# Patient Record
Sex: Male | Born: 1940
Health system: Southern US, Community
[De-identification: ages and names within clinical notes are randomized; demographics above are authoritative.]

## PROBLEM LIST (undated history)

## (undated) DIAGNOSIS — K219 Gastro-esophageal reflux disease without esophagitis: Secondary | ICD-10-CM

## (undated) DIAGNOSIS — H9190 Unspecified hearing loss, unspecified ear: Secondary | ICD-10-CM

## (undated) DIAGNOSIS — T753XXA Motion sickness, initial encounter: Secondary | ICD-10-CM

## (undated) DIAGNOSIS — M1990 Unspecified osteoarthritis, unspecified site: Secondary | ICD-10-CM

## (undated) DIAGNOSIS — J302 Other seasonal allergic rhinitis: Secondary | ICD-10-CM

## (undated) DIAGNOSIS — Z8619 Personal history of other infectious and parasitic diseases: Secondary | ICD-10-CM

## (undated) DIAGNOSIS — C801 Malignant (primary) neoplasm, unspecified: Secondary | ICD-10-CM

## (undated) DIAGNOSIS — M549 Dorsalgia, unspecified: Secondary | ICD-10-CM

## (undated) HISTORY — PX: JOINT REPLACEMENT: SHX530

## (undated) HISTORY — PX: CARPAL TUNNEL RELEASE: SHX101

## (undated) HISTORY — PX: PROSTATE SURGERY: SHX751

## (undated) HISTORY — PX: EYE SURGERY: SHX253

## (undated) HISTORY — PX: UPPER GI ENDOSCOPY: SHX6162

## (undated) HISTORY — DX: Malignant (primary) neoplasm, unspecified: C80.1

## (undated) HISTORY — PX: REPLACEMENT TOTAL KNEE BILATERAL: SUR1225

---

## 2000-07-02 ENCOUNTER — Other Ambulatory Visit: Admission: RE | Admit: 2000-07-02 | Discharge: 2000-07-02 | Payer: Self-pay | Admitting: Urology

## 2000-08-24 ENCOUNTER — Encounter: Payer: Self-pay | Admitting: Obstetrics and Gynecology

## 2000-08-31 ENCOUNTER — Inpatient Hospital Stay (HOSPITAL_COMMUNITY): Admission: RE | Admit: 2000-08-31 | Discharge: 2000-09-03 | Payer: Self-pay | Admitting: Urology

## 2005-07-16 ENCOUNTER — Ambulatory Visit: Payer: Self-pay | Admitting: Gastroenterology

## 2009-09-11 ENCOUNTER — Ambulatory Visit: Payer: Self-pay | Admitting: Gastroenterology

## 2010-02-18 ENCOUNTER — Ambulatory Visit: Payer: Self-pay | Admitting: Internal Medicine

## 2011-06-10 HISTORY — PX: COLONOSCOPY: SHX174

## 2011-12-08 ENCOUNTER — Ambulatory Visit: Payer: Self-pay | Admitting: Family Medicine

## 2012-03-17 ENCOUNTER — Ambulatory Visit: Payer: Self-pay | Admitting: Gastroenterology

## 2012-03-17 DIAGNOSIS — K2270 Barrett's esophagus without dysplasia: Secondary | ICD-10-CM | POA: Insufficient documentation

## 2012-03-19 LAB — PATHOLOGY REPORT

## 2013-02-28 DIAGNOSIS — Z85828 Personal history of other malignant neoplasm of skin: Secondary | ICD-10-CM | POA: Insufficient documentation

## 2013-10-05 DIAGNOSIS — M1710 Unilateral primary osteoarthritis, unspecified knee: Secondary | ICD-10-CM | POA: Insufficient documentation

## 2013-10-05 DIAGNOSIS — M179 Osteoarthritis of knee, unspecified: Secondary | ICD-10-CM | POA: Insufficient documentation

## 2015-08-02 ENCOUNTER — Ambulatory Visit (INDEPENDENT_AMBULATORY_CARE_PROVIDER_SITE_OTHER): Payer: Medicare Other

## 2015-08-02 ENCOUNTER — Ambulatory Visit (INDEPENDENT_AMBULATORY_CARE_PROVIDER_SITE_OTHER): Payer: Medicare Other | Admitting: Podiatry

## 2015-08-02 VITALS — BP 182/89 | HR 55 | Resp 18

## 2015-08-02 DIAGNOSIS — M2022 Hallux rigidus, left foot: Secondary | ICD-10-CM

## 2015-08-02 DIAGNOSIS — R52 Pain, unspecified: Secondary | ICD-10-CM

## 2015-08-02 DIAGNOSIS — M2021 Hallux rigidus, right foot: Secondary | ICD-10-CM | POA: Diagnosis not present

## 2015-08-02 NOTE — Progress Notes (Signed)
Subjective:     Patient ID: Kevin Oneal, male   DOB: 07-04-1940, 75 y.o.   MRN: [id number]  HPI 75 year old male presents to the office today for concerns of pain in his big toe joint on the right foot. It has been onging for "quit awhile but getting worse". Wearing dress shoes hurts the most. He is able to wear regular sneaker is well working now without any discomfort. No tingling or numbness. He gets some redness over the joint of the big toe. He has had no treatment.   Review of Systems  All other systems reviewed and are negative.      Objective:   Physical Exam General: AAO x3, NAD  Dermatological: Skin is warm, dry and supple bilateral. Nails x 10 are well manicured; remaining integument appears unremarkable at this time. There are no open sores, no preulcerative lesions, no rash or signs of infection present.  Vascular: Dorsalis Pedis artery and Posterior Tibial artery pedal pulses are 2/4 bilateral with immedate capillary fill time. Pedal hair growth present. No varicosities and no lower extremity edema present bilateral. There is no pain with calf compression, swelling, warmth, erythema.   Neruologic: Grossly intact via light touch bilateral. Vibratory intact via tuning fork bilateral. Protective threshold with Semmes Wienstein monofilament intact to all pedal sites bilateral. Patellar and Achilles deep tendon reflexes 2+ bilateral. No Babinski or clonus noted bilateral.   Musculoskeletal: There is very limited range of motion of the right first MTPJ and there is exostosis present at the dorsal and dorsomedial aspect of the first MTPJ. There is no erythema or increase in warmth. There is crepitation first MTPJ range of motion the right side. There is also decreased range of motion left side however is not painful there is mild crepitation left side. No other areas of tenderness to bilateral lower extremities. MMT 5/5, ROM WNL except otherwise stated.   Gait: Unassisted,  Nonantalgic.      Assessment:     Hallux rigidus right foot    Plan:     -Treatment options discussed including all alternatives, risks, and complications -X-rays were obtained and reviewed with the patient.  -Etiology of symptoms were discussed -I discussed both conservative and surgical treatment options with the patient. At this point healing has pain with his dress shoes and he wears a loafer style shoe. I discussed with him shoe gear changes for dress shoes. Also discuss orthotics. He wishes to hold off on any other treatment at this point including steroid injection until he tries the shoe changes. -Follow-up as needed. Call if questions or concerns.  Celesta Gentile, DPM

## 2016-05-28 ENCOUNTER — Encounter: Payer: Self-pay | Admitting: Family Medicine

## 2016-05-28 ENCOUNTER — Ambulatory Visit (INDEPENDENT_AMBULATORY_CARE_PROVIDER_SITE_OTHER): Payer: Medicare Other | Admitting: Family Medicine

## 2016-05-28 VITALS — BP 120/76 | HR 64 | Ht 67.0 in | Wt 173.0 lb

## 2016-05-28 DIAGNOSIS — Z23 Encounter for immunization: Secondary | ICD-10-CM | POA: Diagnosis not present

## 2016-05-28 DIAGNOSIS — Z0000 Encounter for general adult medical examination without abnormal findings: Secondary | ICD-10-CM | POA: Diagnosis not present

## 2016-05-28 DIAGNOSIS — H9193 Unspecified hearing loss, bilateral: Secondary | ICD-10-CM | POA: Diagnosis not present

## 2016-05-28 DIAGNOSIS — Z1211 Encounter for screening for malignant neoplasm of colon: Secondary | ICD-10-CM | POA: Diagnosis not present

## 2016-05-28 DIAGNOSIS — I10 Essential (primary) hypertension: Secondary | ICD-10-CM

## 2016-05-28 LAB — HEMOCCULT GUIAC POC 1CARD (OFFICE): Fecal Occult Blood, POC: NEGATIVE

## 2016-05-28 NOTE — Progress Notes (Signed)
Name: Kevin Oneal   MRN: [id number]    DOB: 1940-07-08   Date:05/28/2016       Progress Note  Subjective  Chief Complaint  Chief Complaint  Patient presents with  . Annual Exam    having hearing problems "watching television"    Patient presents for annual physical exam.    No problem-specific Assessment & Plan notes found for this encounter.   Past Medical History:  Diagnosis Date  . Cancer [facility])    prostate    Past Surgical History:  Procedure Laterality Date  . COLONOSCOPY  06/10/2011   Dr Candace Cruise- normal  . PROSTATE SURGERY     removal of prostate due to cancer  . REPLACEMENT TOTAL KNEE BILATERAL      Family History  Problem Relation Age of Onset  . Cancer Father     Social History   Social History  . Marital status: Married    Spouse name: N/A  . Number of children: N/A  . Years of education: N/A   Occupational History  . Not on file.   Social History Main Topics  . Smoking status: Former Research scientist (life sciences)  . Smokeless tobacco: Never Used  . Alcohol use Yes  . Drug use: No  . Sexual activity: Yes   Other Topics Concern  . Not on file   Social History Narrative  . No narrative on file    No Known Allergies   Review of Systems  Constitutional: Negative for chills, fever, malaise/fatigue and weight loss.  HENT: Positive for hearing loss. Negative for ear discharge, ear pain and sore throat.   Eyes: Negative for blurred vision.  Respiratory: Negative for cough, sputum production, shortness of breath and wheezing.   Cardiovascular: Negative for chest pain, palpitations and leg swelling.  Gastrointestinal: Negative for abdominal pain, blood in stool, constipation, diarrhea, heartburn, melena and nausea.  Genitourinary: Negative for dysuria, frequency, hematuria and urgency.  Musculoskeletal: Negative for back pain, joint pain, myalgias and neck pain.  Skin: Negative for rash.  Neurological: Negative for dizziness, tingling, sensory change, focal  weakness and headaches.  Endo/Heme/Allergies: Negative for environmental allergies and polydipsia. Does not bruise/bleed easily.  Psychiatric/Behavioral: Negative for depression and suicidal ideas. The patient is not nervous/anxious and does not have insomnia.      Objective  Vitals:   05/28/16 0855  BP: 120/76  Pulse: 64  Weight: 173 lb (78.5 kg)  Height: 5\' 7"  (1.702 m)    Physical Exam  Constitutional: He is oriented to person, place, and time and well-developed, well-nourished, and in no distress.  HENT:  Head: Normocephalic.  Right Ear: External ear normal.  Left Ear: External ear normal.  Nose: Nose normal.  Mouth/Throat: Oropharynx is clear and moist.  Eyes: Conjunctivae and EOM are normal. Pupils are equal, round, and reactive to light. Right eye exhibits no discharge. Left eye exhibits no discharge. No scleral icterus.  Neck: Normal range of motion. Neck supple. No JVD present. No tracheal deviation present. No thyromegaly present.  Cardiovascular: Normal rate, regular rhythm, normal heart sounds and intact distal pulses.  Exam reveals no gallop and no friction rub.   No murmur heard. Pulmonary/Chest: Breath sounds normal. No respiratory distress. He has no wheezes. He has no rales.  Abdominal: Soft. Bowel sounds are normal. He exhibits no mass. There is no hepatosplenomegaly. There is no tenderness. There is no rebound, no guarding and no CVA tenderness.  Genitourinary: Rectum normal and prostate normal.  Musculoskeletal: Normal range of motion. He  exhibits no edema or tenderness.  Lymphadenopathy:    He has no cervical adenopathy.  Neurological: He is alert and oriented to person, place, and time. He has normal sensation, normal strength, normal reflexes and intact cranial nerves. No cranial nerve deficit.  Skin: Skin is warm. No rash noted.  Psychiatric: Mood and affect normal.  Nursing note and vitals reviewed.     Assessment & Plan  Problem List Items  Addressed This Visit    None    Visit Diagnoses    Annual physical exam    -  Primary   Decreased hearing of both ears       Relevant Orders   Ambulatory referral to ENT   Essential hypertension       Relevant Medications   aspirin EC 81 MG tablet   Other Relevant Orders   Renal Function Panel   Lipid Profile   Need for pneumococcal vaccination       Relevant Orders   Pneumococcal conjugate vaccine 13-valent (Completed)   Colon cancer screening       Relevant Orders   POCT Occult Blood Stool (Completed)        Dr. Otilio Miu [facility]  05/28/16

## 2016-05-29 ENCOUNTER — Other Ambulatory Visit: Payer: Self-pay

## 2016-05-29 LAB — RENAL FUNCTION PANEL
Albumin: 4.7 g/dL (ref 3.5–4.8)
BUN / CREAT RATIO: 14 (ref 10–24)
BUN: 14 mg/dL (ref 8–27)
CALCIUM: 9.4 mg/dL (ref 8.6–10.2)
CO2: 25 mmol/L (ref 18–29)
CREATININE: 1.01 mg/dL (ref 0.76–1.27)
Chloride: 99 mmol/L (ref 96–106)
GFR calc non Af Amer: 71 mL/min/{1.73_m2} (ref >59)
GFR, EST AFRICAN AMERICAN: 83 mL/min/{1.73_m2} (ref >59)
Glucose: 84 mg/dL (ref 65–99)
Phosphorus: 2.8 mg/dL (ref 2.5–4.5)
Potassium: 4.7 mmol/L (ref 3.5–5.2)
SODIUM: 141 mmol/L (ref 134–144)

## 2016-05-29 LAB — LIPID PANEL
CHOL/HDL RATIO: 4 ratio (ref 0.0–5.0)
CHOLESTEROL TOTAL: 198 mg/dL (ref 100–199)
HDL: 49 mg/dL (ref >39)
LDL CALC: 130 mg/dL — AB (ref 0–99)
Triglycerides: 95 mg/dL (ref 0–149)
VLDL CHOLESTEROL CAL: 19 mg/dL (ref 5–40)

## 2017-07-01 ENCOUNTER — Ambulatory Visit: Payer: Medicare Other

## 2017-07-02 ENCOUNTER — Encounter: Payer: Medicare Other | Admitting: Family Medicine

## 2017-07-30 ENCOUNTER — Ambulatory Visit (INDEPENDENT_AMBULATORY_CARE_PROVIDER_SITE_OTHER): Payer: Medicare Other

## 2017-07-30 ENCOUNTER — Ambulatory Visit (INDEPENDENT_AMBULATORY_CARE_PROVIDER_SITE_OTHER): Payer: Medicare Other | Admitting: Family Medicine

## 2017-07-30 ENCOUNTER — Encounter: Payer: Self-pay | Admitting: Family Medicine

## 2017-07-30 VITALS — BP 130/80 | HR 64 | Ht 67.0 in | Wt 173.0 lb

## 2017-07-30 DIAGNOSIS — M6740 Ganglion, unspecified site: Secondary | ICD-10-CM

## 2017-07-30 DIAGNOSIS — Z0000 Encounter for general adult medical examination without abnormal findings: Secondary | ICD-10-CM

## 2017-07-30 DIAGNOSIS — Z1211 Encounter for screening for malignant neoplasm of colon: Secondary | ICD-10-CM

## 2017-07-30 DIAGNOSIS — Z23 Encounter for immunization: Secondary | ICD-10-CM

## 2017-07-30 DIAGNOSIS — E7800 Pure hypercholesterolemia, unspecified: Secondary | ICD-10-CM

## 2017-07-30 DIAGNOSIS — D225 Melanocytic nevi of trunk: Secondary | ICD-10-CM

## 2017-07-30 LAB — HEMOCCULT GUIAC POC 1CARD (OFFICE): Fecal Occult Blood, POC: NEGATIVE

## 2017-07-30 NOTE — Progress Notes (Signed)
Name: DELSHAWN Oneal   MRN: [id number]    DOB: [birth year]   Date:07/30/2017       Progress Note  Subjective  Chief Complaint  Chief Complaint  Patient presents with  . Annual Exam    L) hand feels like it is asleep when he wakes up in the middle of the night.  . Ganglion Cyst    R) wrist- has been there "for years"- but now is hurting him    Patient physical exam.   Wrist Pain   The pain is present in the right wrist. This is a chronic (ganglion) problem. The current episode started more than 1 year ago. There has been no history of extremity trauma. The problem occurs constantly (pain). The problem has been gradually worsening. The quality of the pain is described as aching. The pain is at a severity of 7/10. The pain is moderate. Pertinent negatives include no fever, numbness, stiffness or tingling. The symptoms are aggravated by activity. He has tried NSAIDS for the symptoms. The treatment provided no relief.  Hand Pain   The incident occurred more than 1 week ago. There was no injury mechanism. The pain is present in the right fingers (mp/thumb). The quality of the pain is described as aching. The pain is moderate. Pertinent negatives include no chest pain, numbness or tingling.    No problem-specific Assessment & Plan notes found for this encounter.   Past Medical History:  Diagnosis Date  . Cancer [facility])    prostate    Past Surgical History:  Procedure Laterality Date  . COLONOSCOPY  06/10/2011   Dr Candace Cruise- normal  . PROSTATE SURGERY     removal of prostate due to cancer  . REPLACEMENT TOTAL KNEE BILATERAL      Family History  Problem Relation Age of Onset  . Cancer Father   . Diabetes Mother   . Diabetes Brother     Social History   Socioeconomic History  . Marital status: Divorced    Spouse name: Not on file  . Number of children: 3  . Years of education: some college  . Highest education level: 12th grade  Social Needs  . Financial resource strain: Not  hard at all  . Food insecurity - worry: Never true  . Food insecurity - inability: Never true  . Transportation needs - medical: No  . Transportation needs - non-medical: No  Occupational History  . Occupation: Retired  Tobacco Use  . Smoking status: Former Smoker    Packs/day: 0.50    Years: 11.00    Pack years: 5.50    Types: Cigarettes    Last attempt to quit: 1980    Years since quitting: 39.1  . Smokeless tobacco: Never Used  . Tobacco comment: smoking cessation materials not required  Substance and Sexual Activity  . Alcohol use: Yes    Alcohol/week: 4.2 oz    Types: 7 Shots of liquor per week  . Drug use: No  . Sexual activity: Yes  Other Topics Concern  . Not on file  Social History Narrative  . Not on file    No Known Allergies  Outpatient Medications Prior to Visit  Medication Sig Dispense Refill  . aspirin EC 81 MG tablet Take 81 mg by mouth daily.    Marland Kitchen ibuprofen (ADVIL,MOTRIN) 200 MG tablet Take 200 mg by mouth every 6 (six) hours as needed.    . Multiple Vitamins-Minerals (ONE-A-DAY MENS 50+ ADVANTAGE PO) Take 1 tablet by mouth daily.    Marland Kitchen  niacin 500 MG tablet Take 1 tablet by mouth daily.    . Omega-3 1000 MG CAPS Take 2 capsules by mouth daily.    . naproxen sodium (ALEVE) 220 MG tablet Take 1 tablet by mouth as needed.     No facility-administered medications prior to visit.     Review of Systems  Constitutional: Negative for chills, fever, malaise/fatigue and weight loss.  HENT: Negative for ear discharge, ear pain and sore throat.   Eyes: Negative for blurred vision.  Respiratory: Negative for cough, sputum production, shortness of breath and wheezing.   Cardiovascular: Negative for chest pain, palpitations and leg swelling.  Gastrointestinal: Negative for abdominal pain, blood in stool, constipation, diarrhea, heartburn, melena and nausea.  Genitourinary: Negative for dysuria, frequency, hematuria and urgency.  Musculoskeletal: Negative for back  pain, joint pain, myalgias, neck pain and stiffness.  Skin: Negative for rash.  Neurological: Negative for dizziness, tingling, sensory change, focal weakness, numbness and headaches.  Endo/Heme/Allergies: Negative for environmental allergies and polydipsia. Does not bruise/bleed easily.  Psychiatric/Behavioral: Negative for depression and suicidal ideas. The patient is not nervous/anxious and does not have insomnia.      Objective  Vitals:   07/30/17 0835  BP: 130/80  Pulse: 64  Weight: 173 lb (78.5 kg)  Height: 5\' 7"  (1.702 m)    Physical Exam  Constitutional: He is oriented to person, place, and time and well-developed, well-nourished, and in no distress. Vital signs are normal.  HENT:  Head: Normocephalic.  Right Ear: Tympanic membrane, external ear and ear canal normal.  Left Ear: Tympanic membrane, external ear and ear canal normal.  Nose: Nose normal.  Mouth/Throat: Uvula is midline, oropharynx is clear and moist and mucous membranes are normal. No oropharyngeal exudate, posterior oropharyngeal edema or posterior oropharyngeal erythema.  Eyes: Conjunctivae, EOM and lids are normal. Pupils are equal, round, and reactive to light. Right eye exhibits no discharge. Left eye exhibits no discharge. No scleral icterus.  Fundoscopic exam:      The right eye shows no arteriolar narrowing and no AV nicking.       The left eye shows no arteriolar narrowing and no AV nicking.  Neck: Trachea normal and normal range of motion. Neck supple. Normal carotid pulses, no hepatojugular reflux and no JVD present. Carotid bruit is not present. No tracheal deviation present. No thyroid mass and no thyromegaly present.  Cardiovascular: Normal rate, regular rhythm, S1 normal, S2 normal, normal heart sounds, intact distal pulses and normal pulses. PMI is not displaced. Exam reveals no gallop, no S3, no S4, no friction rub and no decreased pulses.  No murmur heard. Pulmonary/Chest: Breath sounds normal.  No respiratory distress. He has no wheezes. He has no rales. Right breast exhibits no mass. Left breast exhibits no mass.  Abdominal: Soft. Normal aorta and bowel sounds are normal. He exhibits no mass. There is no hepatosplenomegaly. There is no tenderness. There is no rebound, no guarding and no CVA tenderness.  Genitourinary: Rectum normal, prostate normal and testes/scrotum normal.  Genitourinary Comments: No prostate /exam c/x history  Musculoskeletal: Normal range of motion. He exhibits no edema.       Right wrist: He exhibits tenderness and deformity.  Ganglion cyst/tenderness fisrt MP  Lymphadenopathy:       Head (right side): No submental and no submandibular adenopathy present.       Head (left side): No submental and no submandibular adenopathy present.    He has no cervical adenopathy.  Neurological: He is  alert and oriented to person, place, and time. He has normal sensation, normal strength, normal reflexes and intact cranial nerves. No cranial nerve deficit.  Skin: Skin is warm. No rash noted.  Irregular pigment nevus? chest  Psychiatric: Mood and affect normal.  Nursing note and vitals reviewed.     Assessment & Plan  Problem List Items Addressed This Visit      Other   Pure hypercholesterolemia   Relevant Orders   Lipid panel    Other Visit Diagnoses    Annual physical exam    -  Primary   Relevant Orders   Renal Function Panel   POCT occult blood stool (Completed)   Melanocytic nevus of trunk       Relevant Orders   Ambulatory referral to Dermatology   Ganglion cyst       Relevant Orders   Ambulatory referral to Orthopedic Surgery   Colon cancer screening       Relevant Orders   POCT occult blood stool (Completed)      No orders of the defined types were placed in this encounter. Kevin Oneal is a 77 y.o. male who presents today for his Complete Annual Exam. He feels well. He reports exercising daily. He reports he is sleeping well.  Immunizations  are reviewed and recommendations provided.   Age appropriate screening tests are discussed. Counseling given for risk factor reduction interventions. Health risks of being over weight were discussed and patient was counseled on weight loss options and exercise.  Dr. [facility]  07/30/17

## 2017-07-30 NOTE — Patient Instructions (Signed)
[name] , Thank you for taking time to come for your Medicare Wellness Visit. I appreciate your ongoing commitment to your health goals. Please review the following plan we discussed and let me know if I can assist you in the future.   Screening recommendations/referrals: Colorectal Screening: Completed colonoscopy 09/11/09. Repeat every 10 years  Vision/Dental Exams: Recommended yearly ophthalmology/optometry visit for glaucoma screening and checkup Recommended yearly dental visit for hygiene and checkup  Vaccinations: Influenza vaccine: Up to date Pneumococcal vaccine: Completed series today Tdap vaccine: Up to date Shingles vaccine: Please call your insurance company to determine your out of pocket expense for the Shingrix vaccine. You may also receive this vaccine at your local pharmacy or Health Dept.   Advanced directives: Advance directive discussed with you today. I have provided a copy for you to complete at home and have notarized. Once this is complete please bring a copy in to our office so we can scan it into your chart.  Conditions/risks identified: Recommend to drink at least 6-8 8oz glasses of water per day.  Next appointment: Please schedule your Annual Wellness Visit with your Nurse Health Advisor in one year.  Preventive Care 27 Years and Older, Male Preventive care refers to lifestyle choices and visits with your health care provider that can promote health and wellness. What does preventive care include?  A yearly physical exam. This is also called an annual well check.  Dental exams once or twice a year.  Routine eye exams. Ask your health care provider how often you should have your eyes checked.  Personal lifestyle choices, including:  Daily care of your teeth and gums.  Regular physical activity.  Eating a healthy diet.  Avoiding tobacco and drug use.  Limiting alcohol use.  Practicing safe sex.  Taking low doses of aspirin every day.  Taking  vitamin and mineral supplements as recommended by your health care provider. What happens during an annual well check? The services and screenings done by your health care provider during your annual well check will depend on your age, overall health, lifestyle risk factors, and family history of disease. Counseling  Your health care provider may ask you questions about your:  Alcohol use.  Tobacco use.  Drug use.  Emotional well-being.  Home and relationship well-being.  Sexual activity.  Eating habits.  History of falls.  Memory and ability to understand (cognition).  Work and work Statistician. Screening  You may have the following tests or measurements:  Height, weight, and BMI.  Blood pressure.  Lipid and cholesterol levels. These may be checked every 5 years, or more frequently if you are over 93 years old.  Skin check.  Lung cancer screening. You may have this screening every year starting at age 9 if you have a 30-pack-year history of smoking and currently smoke or have quit within the past 15 years.  Fecal occult blood test (FOBT) of the stool. You may have this test every year starting at age 25.  Flexible sigmoidoscopy or colonoscopy. You may have a sigmoidoscopy every 5 years or a colonoscopy every 10 years starting at age 73.  Prostate cancer screening. Recommendations will vary depending on your family history and other risks.  Hepatitis C blood test.  Hepatitis B blood test.  Sexually transmitted disease (STD) testing.  Diabetes screening. This is done by checking your blood sugar (glucose) after you have not eaten for a while (fasting). You may have this done every 1-3 years.  Abdominal aortic aneurysm (  AAA) screening. You may need this if you are a current or former smoker.  Osteoporosis. You may be screened starting at age 62 if you are at high risk. Talk with your health care provider about your test results, treatment options, and if  necessary, the need for more tests. Vaccines  Your health care provider may recommend certain vaccines, such as:  Influenza vaccine. This is recommended every year.  Tetanus, diphtheria, and acellular pertussis (Tdap, Td) vaccine. You may need a Td booster every 10 years.  Zoster vaccine. You may need this after age 81.  Pneumococcal 13-valent conjugate (PCV13) vaccine. One dose is recommended after age 54.  Pneumococcal polysaccharide (PPSV23) vaccine. One dose is recommended after age 48. Talk to your health care provider about which screenings and vaccines you need and how often you need them. This information is not intended to replace advice given to you by your health care provider. Make sure you discuss any questions you have with your health care provider. Document Released: 06/22/2015 Document Revised: 02/13/2016 Document Reviewed: 03/27/2015 Elsevier Interactive Patient Education  2017 [place] Prevention in the Home Falls can cause injuries. They can happen to people of all ages. There are many things you can do to make your home safe and to help prevent falls. What can I do on the outside of my home?  Regularly fix the edges of walkways and driveways and fix any cracks.  Remove anything that might make you trip as you walk through a door, such as a raised step or threshold.  Trim any bushes or trees on the path to your home.  Use bright outdoor lighting.  Clear any walking paths of anything that might make someone trip, such as rocks or tools.  Regularly check to see if handrails are loose or broken. Make sure that both sides of any steps have handrails.  Any raised decks and porches should have guardrails on the edges.  Have any leaves, snow, or ice cleared regularly.  Use sand or salt on walking paths during winter.  Clean up any spills in your garage right away. This includes oil or grease spills. What can I do in the bathroom?  Use night  lights.  Install grab bars by the toilet and in the tub and shower. Do not use towel bars as grab bars.  Use non-skid mats or decals in the tub or shower.  If you need to sit down in the shower, use a plastic, non-slip stool.  Keep the floor dry. Clean up any water that spills on the floor as soon as it happens.  Remove soap buildup in the tub or shower regularly.  Attach bath mats securely with double-sided non-slip rug tape.  Do not have throw rugs and other things on the floor that can make you trip. What can I do in the bedroom?  Use night lights.  Make sure that you have a light by your bed that is easy to reach.  Do not use any sheets or blankets that are too big for your bed. They should not hang down onto the floor.  Have a firm chair that has side arms. You can use this for support while you get dressed.  Do not have throw rugs and other things on the floor that can make you trip. What can I do in the kitchen?  Clean up any spills right away.  Avoid walking on wet floors.  Keep items that you use a lot in  easy-to-reach places.  If you need to reach something above you, use a strong step stool that has a grab bar.  Keep electrical cords out of the way.  Do not use floor polish or wax that makes floors slippery. If you must use wax, use non-skid floor wax.  Do not have throw rugs and other things on the floor that can make you trip. What can I do with my stairs?  Do not leave any items on the stairs.  Make sure that there are handrails on both sides of the stairs and use them. Fix handrails that are broken or loose. Make sure that handrails are as long as the stairways.  Check any carpeting to make sure that it is firmly attached to the stairs. Fix any carpet that is loose or worn.  Avoid having throw rugs at the top or bottom of the stairs. If you do have throw rugs, attach them to the floor with carpet tape.  Make sure that you have a light switch at the  top of the stairs and the bottom of the stairs. If you do not have them, ask someone to add them for you. What else can I do to help prevent falls?  Wear shoes that:  Do not have high heels.  Have rubber bottoms.  Are comfortable and fit you well.  Are closed at the toe. Do not wear sandals.  If you use a stepladder:  Make sure that it is fully opened. Do not climb a closed stepladder.  Make sure that both sides of the stepladder are locked into place.  Ask someone to hold it for you, if possible.  Clearly mark and make sure that you can see:  Any grab bars or handrails.  First and last steps.  Where the edge of each step is.  Use tools that help you move around (mobility aids) if they are needed. These include:  Canes.  Walkers.  Scooters.  Crutches.  Turn on the lights when you go into a dark area. Replace any light bulbs as soon as they burn out.  Set up your furniture so you have a clear path. Avoid moving your furniture around.  If any of your floors are uneven, fix them.  If there are any pets around you, be aware of where they are.  Review your medicines with your doctor. Some medicines can make you feel dizzy. This can increase your chance of falling. Ask your doctor what other things that you can do to help prevent falls. This information is not intended to replace advice given to you by your health care provider. Make sure you discuss any questions you have with your health care provider. Document Released: 03/22/2009 Document Revised: 11/01/2015 Document Reviewed: 06/30/2014 Elsevier Interactive Patient Education  2017 [employer].

## 2017-07-30 NOTE — Progress Notes (Signed)
Subjective:   Kevin Oneal is a 77 y.o. male who presents for Medicare Annual/Subsequent preventive examination.  Review of Systems:  N/A Cardiac Risk Factors include: advanced age (>37men, >60 women);dyslipidemia     Objective:    Vitals: BP 130/80   Pulse 64   Ht 5\' 7"  (1.702 m)   Wt 173 lb (78.5 kg)   BMI 27.10 kg/m   Body mass index is 27.1 kg/m.  Advanced Directives 07/30/2017  Does Patient Have a Medical Advance Directive? No  Would patient like information on creating a medical advance directive? Yes (MAU/Ambulatory/Procedural Areas - Information given)    Tobacco Social History   Tobacco Use  Smoking Status Former Smoker  . Packs/day: 0.50  . Years: 11.00  . Pack years: 5.50  . Types: Cigarettes  . Last attempt to quit: 1980  . Years since quitting: 39.1  Smokeless Tobacco Never Used  Tobacco Comment   smoking cessation materials not required     Counseling given: No Comment: smoking cessation materials not required   Clinical Intake:  Pre-visit preparation completed: Yes  Pain : No/denies pain   BMI - recorded: 27.1 Nutritional Status: BMI 25 -29 Overweight Nutritional Risks: None Diabetes: No  How often do you need to have someone help you when you read instructions, pamphlets, or other written materials from your doctor or pharmacy?: 1 - Never  Interpreter Needed?: No  Information entered by :: AEversole, LPN  Past Medical History:  Diagnosis Date  . Cancer [facility])    prostate   Past Surgical History:  Procedure Laterality Date  . COLONOSCOPY  06/10/2011   Dr Candace Cruise- normal  . PROSTATE SURGERY     removal of prostate due to cancer  . REPLACEMENT TOTAL KNEE BILATERAL     Family History  Problem Relation Age of Onset  . Cancer Father   . Diabetes Mother   . Diabetes Brother    Social History   Socioeconomic History  . Marital status: Divorced    Spouse name: None  . Number of children: 3  . Years of education: some college    . Highest education level: 12th grade  Social Needs  . Financial resource strain: Not hard at all  . Food insecurity - worry: Never true  . Food insecurity - inability: Never true  . Transportation needs - medical: No  . Transportation needs - non-medical: No  Occupational History  . Occupation: Retired  Tobacco Use  . Smoking status: Former Smoker    Packs/day: 0.50    Years: 11.00    Pack years: 5.50    Types: Cigarettes    Last attempt to quit: 1980    Years since quitting: 39.1  . Smokeless tobacco: Never Used  . Tobacco comment: smoking cessation materials not required  Substance and Sexual Activity  . Alcohol use: Yes    Alcohol/week: 4.2 oz    Types: 7 Shots of liquor per week  . Drug use: No  . Sexual activity: Yes  Other Topics Concern  . None  Social History Narrative  . None    Outpatient Encounter Medications as of 07/30/2017  Medication Sig  . aspirin EC 81 MG tablet Take 81 mg by mouth daily.  Marland Kitchen ibuprofen (ADVIL,MOTRIN) 200 MG tablet Take 200 mg by mouth every 6 (six) hours as needed.  . Multiple Vitamins-Minerals (ONE-A-DAY MENS 50+ ADVANTAGE PO) Take 1 tablet by mouth daily.  . niacin 500 MG tablet Take 1 tablet by mouth daily.  Marland Kitchen  Omega-3 1000 MG CAPS Take 2 capsules by mouth daily.   No facility-administered encounter medications on file as of 07/30/2017.     Activities of Daily Living In your present state of health, do you have any difficulty performing the following activities: 07/30/2017  Hearing? Y  Comment tinnitus; followed by audiology; denies hearing aids  Vision? N  Comment wears contacts  Difficulty concentrating or making decisions? Y  Comment short term memory loss  Walking or climbing stairs? N  Dressing or bathing? N  Doing errands, shopping? N  Preparing Food and eating ? N  Comment denies dentures  Using the Toilet? N  In the past six months, have you accidently leaked urine? Y  Comment stress incontinence  Do you have  problems with loss of bowel control? N  Managing your Medications? N  Managing your Finances? N  Housekeeping or managing your Housekeeping? N  Some recent data might be hidden    Patient Care Team: Juline Patch, MD as PCP - General (Family Medicine)   Assessment:   This is a routine wellness examination for [place].  Exercise Activities and Dietary recommendations Current Exercise Habits: Structured exercise class, Type of exercise: stretching;strength training/weights, Time (Minutes): > 60, Frequency (Times/Week): 3, Weekly Exercise (Minutes/Week): 0, Intensity: Mild  Goals    . DIET - INCREASE WATER INTAKE     Recommend to drink at least 6-8 8oz glasses of water per day.       Fall Risk Fall Risk  07/30/2017 07/30/2017 05/28/2016  Falls in the past year? No No No   Is the patient's home free of loose throw rugs in walkways, pet beds, electrical cords, etc?   Yes Does the patient have any grab bars in the bathroom? Yes  Does the patient use a shower chair when bathing? No Does the patient have any stairs in or around the home? Yes If so, are there any handrails?  Yes Does the patient have adequate lighting?  Yes Does the patient use a cane, walker or w/c? No Does the patient use of an elevated toilet seat? Yes   Timed Get Up and Go Performed: Yes. Pt ambulated 10 feet within 7 sec. Gait stead-fast and without the use of an assistive device. No intervention required at this time. Fall risk prevention has been discussed.  Pt declined my offer to send Community Resource Referral to Care Guide for shower chair.  Depression Screen PHQ 2/9 Scores 07/30/2017 07/30/2017 07/30/2017 05/28/2016  PHQ - 2 Score 0 1 1 0  PHQ- 9 Score 0 2 - -    Cognitive Function     6CIT Screen 07/30/2017  What Year? 0 points  What month? 0 points  What time? 0 points  Count back from 20 0 points  Months in reverse 0 points  Repeat phrase 0 points  Total Score 0    Immunization History   Administered Date(s) Administered  . Pneumococcal Conjugate-13 05/28/2016  . Pneumococcal Polysaccharide-23 07/30/2017  . Td 03/26/2015  . Zoster 03/26/2015    Qualifies for Shingles Vaccine? Yes. Zostavax completed 03/1715. Due for Shingrix vaccine. Education has been provided regarding the importance of this vaccine. Pt has been advised to call his insurance company to determine his out of pocket expense. Advised he may also receive this vaccine at his local pharmacy or Health Dept. Verbalized acceptance and understanding.  Screening Tests Health Maintenance  Topic Date Due  . TETANUS/TDAP  03/25/2025  . INFLUENZA VACCINE  Completed  .  PNA vac Low Risk Adult  Completed   Cancer Screenings: Lung: Low Dose CT Chest recommended if Age 82-80 years, 30 pack-year currently smoking OR have quit w/in 15years. Patient does not qualify. Colorectal: Completed colonoscopy 09/11/09. Repeat every 10 years.  Additional Screenings: Hepatitis B/HIV/Syphillis: Does not qualify Hepatitis C Screening: Does not qualify    Plan:  I have personally reviewed and addressed the Medicare Annual Wellness questionnaire and have noted the following in the patient's chart:  A. Medical and social history B. Use of alcohol, tobacco or illicit drugs  C. Current medications and supplements D. Functional ability and status E.  Nutritional status F.  Physical activity G. Advance directives H. List of other physicians I.  Hospitalizations, surgeries, and ER visits in previous 12 months J.  [place] such as hearing and vision if needed, cognitive and depression L. Referrals and appointments - none  In addition, I have reviewed and discussed with patient certain preventive protocols, quality metrics, and best practice recommendations. A written personalized care plan for preventive services as well as general preventive health recommendations were provided to patient.  Signed,  Aleatha Borer,  LPN Nurse Health Advisor  MD Recommendations: Zostavax completed 03/1715. Due for Shingrix vaccine. Education has been provided regarding the importance of this vaccine. Pt has been advised to call his insurance company to determine his out of pocket expense. Advised he may also receive this vaccine at his local pharmacy or Health Dept. Verbalized acceptance and understanding.

## 2017-07-31 ENCOUNTER — Other Ambulatory Visit: Payer: Self-pay

## 2017-07-31 LAB — RENAL FUNCTION PANEL
Albumin: 4.7 g/dL (ref 3.5–4.8)
BUN/Creatinine Ratio: 15 (ref 10–24)
BUN: 14 mg/dL (ref 8–27)
CALCIUM: 9.7 mg/dL (ref 8.6–10.2)
CO2: 21 mmol/L (ref 20–29)
CREATININE: 0.93 mg/dL (ref 0.76–1.27)
Chloride: 101 mmol/L (ref 96–106)
GFR calc Af Amer: 91 mL/min/{1.73_m2} (ref >59)
GFR calc non Af Amer: 78 mL/min/{1.73_m2} (ref >59)
Glucose: 75 mg/dL (ref 65–99)
PHOSPHORUS: 2.9 mg/dL (ref 2.5–4.5)
Potassium: 4.6 mmol/L (ref 3.5–5.2)
SODIUM: 142 mmol/L (ref 134–144)

## 2017-07-31 LAB — LIPID PANEL
CHOLESTEROL TOTAL: 210 mg/dL — AB (ref 100–199)
Chol/HDL Ratio: 3.6 ratio (ref 0.0–5.0)
HDL: 58 mg/dL (ref >39)
LDL CALC: 140 mg/dL — AB (ref 0–99)
TRIGLYCERIDES: 59 mg/dL (ref 0–149)
VLDL Cholesterol Cal: 12 mg/dL (ref 5–40)

## 2017-09-08 DIAGNOSIS — M1811 Unilateral primary osteoarthritis of first carpometacarpal joint, right hand: Secondary | ICD-10-CM | POA: Insufficient documentation

## 2017-09-08 DIAGNOSIS — M79641 Pain in right hand: Secondary | ICD-10-CM | POA: Insufficient documentation

## 2018-06-30 DIAGNOSIS — M75102 Unspecified rotator cuff tear or rupture of left shoulder, not specified as traumatic: Secondary | ICD-10-CM | POA: Insufficient documentation

## 2018-07-15 ENCOUNTER — Ambulatory Visit (INDEPENDENT_AMBULATORY_CARE_PROVIDER_SITE_OTHER): Payer: Medicare Other | Admitting: Family Medicine

## 2018-07-15 ENCOUNTER — Encounter: Payer: Self-pay | Admitting: Family Medicine

## 2018-07-15 VITALS — BP 130/80 | HR 72 | Ht 67.0 in | Wt 169.0 lb

## 2018-07-15 DIAGNOSIS — M65312 Trigger thumb, left thumb: Secondary | ICD-10-CM

## 2018-07-15 DIAGNOSIS — M1812 Unilateral primary osteoarthritis of first carpometacarpal joint, left hand: Secondary | ICD-10-CM | POA: Diagnosis not present

## 2018-07-15 DIAGNOSIS — G5602 Carpal tunnel syndrome, left upper limb: Secondary | ICD-10-CM | POA: Diagnosis not present

## 2018-07-15 DIAGNOSIS — S0300XA Dislocation of jaw, unspecified side, initial encounter: Secondary | ICD-10-CM

## 2018-07-15 MED ORDER — PREDNISONE 10 MG PO TABS: ORAL_TABLET | ORAL | 1 refills | Status: DC

## 2018-07-15 NOTE — Patient Instructions (Signed)
Carpal Tunnel Syndrome  Carpal tunnel syndrome is a condition that causes pain in your hand and arm. The carpal tunnel is a narrow area located on the palm side of your wrist. Repeated wrist motion or certain diseases may cause swelling within the tunnel. This swelling pinches the main nerve in the wrist (median nerve). What are the causes? This condition may be caused by:  Repeated wrist motions.  Wrist injuries.  Arthritis.  A cyst or tumor in the carpal tunnel.  Fluid buildup during pregnancy. Sometimes the cause of this condition is not known. What increases the risk? The following factors may make you more likely to develop this condition:  Having a job, such as being a butcher or a cashier, that requires you to repeatedly move your wrist in the same motion.  Being a woman.  Having certain conditions, such as: ? Diabetes. ? Obesity. ? An underactive thyroid (hypothyroidism). ? Kidney failure. What are the signs or symptoms? Symptoms of this condition include:  A tingling feeling in your fingers, especially in your thumb, index, and middle fingers.  Tingling or numbness in your hand.  An aching feeling in your entire arm, especially when your wrist and elbow are bent for a long time.  Wrist pain that goes up your arm to your shoulder.  Pain that goes down into your palm or fingers.  A weak feeling in your hands. You may have trouble grabbing and holding items. Your symptoms may feel worse during the night. How is this diagnosed? This condition is diagnosed with a medical history and physical exam. You may also have tests, including:  Electromyogram (EMG). This test measures electrical signals sent by your nerves into the muscles.  Nerve conduction study. This test measures how well electrical signals pass through your nerves.  Imaging tests, such as X-rays, ultrasound, and MRI. These tests check for possible causes of your condition. How is this treated? This  condition may be treated with:  Lifestyle changes. It is important to stop or change the activity that caused your condition.  Doing exercise and activities to strengthen your muscles and bones (physical therapy).  Learning how to use your hand again after diagnosis (occupational therapy).  Medicines for pain and inflammation. This may include medicine that is injected into your wrist.  A wrist splint.  Surgery. Follow these instructions at home: If you have a splint:  Wear the splint as told by your health care provider. Remove it only as told by your health care provider.  Loosen the splint if your fingers tingle, become numb, or turn cold and blue.  Keep the splint clean.  If the splint is not waterproof: ? Do not let it get wet. ? Cover it with a watertight covering when you take a bath or shower. Managing pain, stiffness, and swelling   If directed, put ice on the painful area: ? If you have a removable splint, remove it as told by your health care provider. ? Put ice in a plastic bag. ? Place a towel between your skin and the bag. ? Leave the ice on for 20 minutes, 2-3 times per day. General instructions  Take over-the-counter and prescription medicines only as told by your health care provider.  Rest your wrist from any activity that may be causing your pain. If your condition is work related, talk with your employer about changes that can be made, such as getting a wrist pad to use while typing.  Do any exercises as told   by your health care provider, physical therapist, or occupational therapist.  Keep all follow-up visits as told by your health care provider. This is important. Contact a health care provider if:  You have new symptoms.  Your pain is not controlled with medicines.  Your symptoms get worse. Get help right away if:  You have severe numbness or tingling in your wrist or hand. Summary  Carpal tunnel syndrome is a condition that causes pain in  your hand and arm.  It is usually caused by repeated wrist motions.  Lifestyle changes and medicines are used to treat carpal tunnel syndrome. Surgery may be recommended.  Follow your health care provider's instructions about wearing a splint, resting from activity, keeping follow-up visits, and calling for help. This information is not intended to replace advice given to you by your health care provider. Make sure you discuss any questions you have with your health care provider. Document Released: 05/23/2000 Document Revised: 10/02/2017 Document Reviewed: 10/02/2017 Elsevier Interactive Patient Education  2019 [employer] Temporomandibular Joint Syndrome  Temporomandibular joint syndrome (TMJ syndrome) is a condition that causes pain in the temporomandibular joints. These joints are located near your ears and allow your jaw to open and close. For people with TMJ syndrome, chewing, biting, or other movements of the jaw can be difficult or painful. TMJ syndrome is often mild and goes away within a few weeks. However, sometimes the condition becomes a long-term (chronic) problem. What are the causes? This condition may be caused by:  Grinding your teeth or clenching your jaw. Some people do this when they are under stress.  Arthritis.  Injury to the jaw.  Head or neck injury.  Teeth or dentures that are not aligned well. In some cases, the cause of TMJ syndrome may not be known. What are the signs or symptoms? The most common symptom of this condition is an aching pain on the side of the head in the area of the TMJ. Other symptoms may include:  Pain when moving your jaw, such as when chewing or biting.  Being unable to open your jaw all the way.  Making a clicking sound when you open your mouth.  Headache.  Earache.  Neck or shoulder pain. How is this diagnosed? This condition may be diagnosed based on:  Your symptoms and medical history.  A physical exam. Your health  care provider may check the range of motion of your jaw.  Imaging tests, such as X-rays or an MRI. You may also need to see your dentist, who will determine if your teeth and jaw are lined up correctly. How is this treated? TMJ syndrome often goes away on its own. If treatment is needed, the options may include:  Eating soft foods and applying ice or heat.  Medicines to relieve pain or inflammation.  Medicines or massage to relax the muscles.  A splint, bite plate, or mouthpiece to prevent teeth grinding or jaw clenching.  Relaxation techniques or counseling to help reduce stress.  A therapy for pain in which an electrical current is applied to the nerves through the skin (transcutaneous electrical nerve stimulation).  Acupuncture. This is sometimes helpful to relieve pain.  Jaw surgery. This is rarely needed. Follow these instructions at home:  Eating and drinking  Eat a soft diet if you are having trouble chewing.  Avoid foods that require a lot of chewing. Do not chew gum. General instructions  Take over-the-counter and prescription medicines only as told by your health care provider.  If directed, put ice on the painful area. ? Put ice in a plastic bag. ? Place a towel between your skin and the bag. ? Leave the ice on for 20 minutes, 2-3 times a day.  Apply a warm, wet cloth (warm compress) to the painful area as directed.  Massage your jaw area and do any jaw stretching exercises as told by your health care provider.  If you were given a splint, bite plate, or mouthpiece, wear it as told by your health care provider.  Keep all follow-up visits as told by your health care provider. This is important. Contact a health care provider if:  You are having trouble eating.  You have new or worsening symptoms. Get help right away if:  Your jaw locks open or closed. Summary  Temporomandibular joint syndrome (TMJ syndrome) is a condition that causes pain in the  temporomandibular joints. These joints are located near your ears and allow your jaw to open and close.  TMJ syndrome is often mild and goes away within a few weeks. However, sometimes the condition becomes a long-term (chronic) problem.  Symptoms include an aching pain on the side of the head in the area of the TMJ, pain when chewing or biting, and being unable to open your jaw all the way. You may also make a clicking sound when you open your mouth.  TMJ syndrome often goes away on its own. If treatment is needed, it may include medicines to relieve pain, reduce inflammation, or relax the muscles. A splint, bite plate, or mouthpiece may also be used to prevent teeth grinding or jaw clenching. This information is not intended to replace advice given to you by your health care provider. Make sure you discuss any questions you have with your health care provider. Document Released: 02/18/2001 Document Revised: 07/07/2017 Document Reviewed: 07/07/2017 Elsevier Interactive Patient Education  2019 [employer].

## 2018-07-15 NOTE — Progress Notes (Signed)
Date:  07/15/2018   Name:  Kevin Oneal   DOB:  01-31-1941   MRN:  [id number]   Chief Complaint: Hand Pain (L) hand pain with tingling and numbness in the last joint of thumb. Was given injection in L) shoulder 2 weeks ago for a torn rotator cuff. hand started after that)  Hand Pain   The incident occurred more than 1 week ago. The injury mechanism was repetitive motion (recent injection/uphopholtery). The pain is present in the left fingers. The quality of the pain is described as aching and shooting (thumb). Radiates to: left wrist. The pain is at a severity of 10/10. The pain is moderate. The pain has been intermittent since the incident. Associated symptoms include muscle weakness, numbness and tingling. Pertinent negatives include no chest pain. The symptoms are aggravated by movement (awaken at night). He has tried NSAIDs for the symptoms. The treatment provided moderate relief.    Review of Systems  Constitutional: Negative for chills and fever.  HENT: Negative for drooling, ear discharge, ear pain, hearing loss, mouth sores, postnasal drip, sinus pressure, sinus pain and sore throat.   Respiratory: Negative for cough, shortness of breath and wheezing.   Cardiovascular: Negative for chest pain, palpitations and leg swelling.  Gastrointestinal: Negative for abdominal pain, blood in stool, constipation, diarrhea and nausea.  Endocrine: Negative for polydipsia.  Genitourinary: Negative for dysuria, frequency, hematuria and urgency.  Musculoskeletal: Negative for back pain, myalgias and neck pain.  Skin: Negative for rash.  Allergic/Immunologic: Negative for environmental allergies.  Neurological: Positive for tingling and numbness. Negative for dizziness and headaches.  Hematological: Does not bruise/bleed easily.  Psychiatric/Behavioral: Negative for suicidal ideas. The patient is not nervous/anxious.     Patient Active Problem List   Diagnosis Date Noted  . Pure  hypercholesterolemia 07/30/2017    No Known Allergies  Past Surgical History:  Procedure Laterality Date  . COLONOSCOPY  06/10/2011   Dr Candace Cruise- normal  . PROSTATE SURGERY     removal of prostate due to cancer  . REPLACEMENT TOTAL KNEE BILATERAL      Social History   Tobacco Use  . Smoking status: Former Smoker    Packs/day: 0.50    Years: 11.00    Pack years: 5.50    Types: Cigarettes    Last attempt to quit: 1980    Years since quitting: 40.1  . Smokeless tobacco: Never Used  . Tobacco comment: smoking cessation materials not required  Substance Use Topics  . Alcohol use: Yes    Alcohol/week: 7.0 standard drinks    Types: 7 Shots of liquor per week  . Drug use: No     Medication list has been reviewed and updated.  Current Meds  Medication Sig  . aspirin EC 81 MG tablet Take 81 mg by mouth daily.  . meloxicam (MOBIC) 15 MG tablet Take 1 tablet by mouth daily.  . Multiple Vitamins-Minerals (ONE-A-DAY MENS 50+ ADVANTAGE PO) Take 1 tablet by mouth daily.  . niacin 500 MG tablet Take 1 tablet by mouth daily.  . Omega-3 1000 MG CAPS Take 2 capsules by mouth daily.  . [DISCONTINUED] ibuprofen (ADVIL,MOTRIN) 200 MG tablet Take 200 mg by mouth every 6 (six) hours as needed.    PHQ 2/9 Scores 07/30/2017 07/30/2017 07/30/2017 05/28/2016  PHQ - 2 Score 0 1 1 0  PHQ- 9 Score 0 2 - -    Physical Exam Vitals signs and nursing note reviewed.  HENT:  Head: Normocephalic.     Jaw: Malocclusion present.     Right Ear: External ear normal.     Left Ear: External ear normal.     Nose: Nose normal.  Eyes:     General: No scleral icterus.       Right eye: No discharge.        Left eye: No discharge.     Conjunctiva/sclera: Conjunctivae normal.     Pupils: Pupils are equal, round, and reactive to light.  Neck:     Musculoskeletal: Normal range of motion and neck supple.     Thyroid: No thyromegaly.     Vascular: No JVD.     Trachea: No tracheal deviation.  Cardiovascular:      Rate and Rhythm: Normal rate and regular rhythm.     Heart sounds: Normal heart sounds. No murmur. No friction rub. No gallop.   Pulmonary:     Effort: No respiratory distress.     Breath sounds: Normal breath sounds. No wheezing or rales.  Abdominal:     General: Bowel sounds are normal.     Palpations: Abdomen is soft. There is no mass.     Tenderness: There is no abdominal tenderness. There is no guarding or rebound.  Musculoskeletal:     Left hand: He exhibits decreased range of motion, tenderness and deformity.     Comments: Positive tinel  Lymphadenopathy:     Cervical: No cervical adenopathy.  Skin:    General: Skin is warm.     Findings: No rash.  Neurological:     Mental Status: He is alert and oriented to person, place, and time.     Cranial Nerves: No cranial nerve deficit.     Deep Tendon Reflexes: Reflexes are normal and symmetric.     BP 130/80   Pulse 72   Ht 5\' 7"  (1.702 m)   Wt 169 lb (76.7 kg)   BMI 26.47 kg/m   Assessment and Plan: 1. 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  Patient is also had a remote history of a fall which probably dislocated his thumb and has gradually been hurting but has increased more so in the past couple of weeks.  Patient is also had issues with waking at night with pain in his wrist as well as his hand with paresthesias.  Initiate prednisone taper will continue meloxicam and will refer to [facility] for evaluation of possible arthritis may be residual opposition of thumb from possible dislocation. - Ambulatory referral to Orthopedic Surgery - predniSONE (DELTASONE) 10 MG tablet; Taper 6,6,6,5,5,5,4,4,3,3,2,2,1,1  Dispense: 53 tablet; Refill: 1  2. Trigger thumb of left hand he is having episodes of the thumb will become stuck in a flexed position this is infrequent however  is concerning when it does happen patient be referred for evaluation by sports medicine orthopedic surgery. - Ambulatory referral to Orthopedic Surgery - predniSONE (DELTASONE) 10 MG tablet; Taper 6,6,6,5,5,5,4,4,3,3,2,2,1,1  Dispense: 53 tablet; Refill: 1  3. Carpal tunnel syndrome of left wrist Upon awakening and at night patient finds that he needs to shake his hand to get feeling back into it this involves his thumb but also over the median distribution the left. hand is well. - Ambulatory referral to Orthopedic Surgery - predniSONE (DELTASONE) 10 MG tablet; Taper 6,6,6,5,5,5,4,4,3,3,2,2,1,1  Dispense: 53 tablet; Refill: 1  4.  Dislocation of temporomandibular joint, initial encounter She has had increasing discomfort of his left TMJ area there is no history of trauma but there is some malocclusion Of TMJ.  Patient will continue meloxicam and will be on a prednisone taper to see if this may also help this she discontinued we may need to refer to oral surgery. - Ambulatory referral to Orthopedic Surgery - predniSONE (DELTASONE) 10 MG tablet; Taper 6,6,6,5,5,5,4,4,3,3,2,2,1,1  Dispense: 53 tablet; Refill: 1

## 2018-08-03 ENCOUNTER — Ambulatory Visit (INDEPENDENT_AMBULATORY_CARE_PROVIDER_SITE_OTHER): Payer: Medicare Other | Admitting: Family Medicine

## 2018-08-03 ENCOUNTER — Encounter: Payer: Self-pay | Admitting: Family Medicine

## 2018-08-03 VITALS — BP 122/80 | HR 60 | Ht 67.0 in | Wt 167.0 lb

## 2018-08-03 DIAGNOSIS — E7800 Pure hypercholesterolemia, unspecified: Secondary | ICD-10-CM

## 2018-08-03 DIAGNOSIS — Z0000 Encounter for general adult medical examination without abnormal findings: Secondary | ICD-10-CM

## 2018-08-03 DIAGNOSIS — Z1211 Encounter for screening for malignant neoplasm of colon: Secondary | ICD-10-CM

## 2018-08-03 LAB — HEMOCCULT GUIAC POC 1CARD (OFFICE): FECAL OCCULT BLD: NEGATIVE

## 2018-08-03 NOTE — Progress Notes (Signed)
Date:  08/03/2018   Name:  Kevin Oneal   DOB:  05-Dec-1940   MRN:  [id number]   Chief Complaint: Annual Exam (labs/ no PSA)  Patient is a 78 year old male who presents for a comprehensive physical exam. The patient reports the following problems: shoulder pain. Health maintenance has been reviewed up to date.   Review of Systems  Constitutional: Negative for activity change, appetite change, chills, fatigue and fever.  HENT: Positive for tinnitus. Negative for congestion, drooling, ear discharge, ear pain, postnasal drip, sinus pressure, sinus pain, sore throat and trouble swallowing.   Eyes: Negative for photophobia, pain, discharge, redness, itching and visual disturbance.  Respiratory: Negative for apnea, cough, choking, chest tightness, shortness of breath, wheezing and stridor.   Cardiovascular: Negative for chest pain, palpitations and leg swelling.  Gastrointestinal: Negative for abdominal distention, abdominal pain, anal bleeding, blood in stool, constipation, diarrhea, nausea, rectal pain and vomiting.  Endocrine: Positive for polydipsia. Negative for cold intolerance, heat intolerance, polyphagia and polyuria.  Genitourinary: Negative for discharge, dysuria, enuresis, flank pain, frequency, genital sores, hematuria, penile pain, penile swelling, scrotal swelling, testicular pain and urgency.  Musculoskeletal: Negative for arthralgias, back pain, gait problem, joint swelling, myalgias, neck pain and neck stiffness.  Skin: Negative for color change, pallor, rash and wound.  Allergic/Immunologic: Negative for environmental allergies.  Neurological: Negative for dizziness, syncope, weakness and headaches.  Hematological: Negative for adenopathy. Does not bruise/bleed easily.  Psychiatric/Behavioral: Negative for suicidal ideas. The patient is not nervous/anxious and is not hyperactive.     Patient Active Problem List   Diagnosis Date Noted  . Pure hypercholesterolemia  07/30/2017    No Known Allergies  Past Surgical History:  Procedure Laterality Date  . COLONOSCOPY  06/10/2011   Dr Candace Cruise- normal  . PROSTATE SURGERY     removal of prostate due to cancer  . REPLACEMENT TOTAL KNEE BILATERAL      Social History   Tobacco Use  . Smoking status: Former Smoker    Packs/day: 0.50    Years: 11.00    Pack years: 5.50    Types: Cigarettes    Last attempt to quit: 1980    Years since quitting: 40.1  . Smokeless tobacco: Never Used  . Tobacco comment: smoking cessation materials not required  Substance Use Topics  . Alcohol use: Yes    Alcohol/week: 7.0 standard drinks    Types: 7 Shots of liquor per week  . Drug use: No     Medication list has been reviewed and updated.  Current Meds  Medication Sig  . aspirin EC 81 MG tablet Take 81 mg by mouth daily.  . meloxicam (MOBIC) 15 MG tablet Take 1 tablet by mouth daily.  . Multiple Vitamins-Minerals (ONE-A-DAY MENS 50+ ADVANTAGE PO) Take 1 tablet by mouth daily.  . niacin 500 MG tablet Take 1 tablet by mouth daily.  . Omega-3 1000 MG CAPS Take 2 capsules by mouth daily.    PHQ 2/9 Scores 08/03/2018 07/30/2017 07/30/2017 07/30/2017  PHQ - 2 Score 0 0 1 1  PHQ- 9 Score 0 0 2 -    Physical Exam Vitals signs and nursing note reviewed.  HENT:     Head: Normocephalic.     Right Ear: External ear normal.     Left Ear: External ear normal.     Nose: Nose normal.  Eyes:     General: No scleral icterus.       Right eye: No discharge.  Left eye: No discharge.     Conjunctiva/sclera: Conjunctivae normal.     Pupils: Pupils are equal, round, and reactive to light.  Neck:     Musculoskeletal: Normal range of motion and neck supple.     Thyroid: No thyromegaly.     Vascular: No JVD.     Trachea: No tracheal deviation.  Cardiovascular:     Rate and Rhythm: Normal rate and regular rhythm.     Heart sounds: Normal heart sounds. No murmur. No friction rub. No gallop.   Pulmonary:     Effort: No  respiratory distress.     Breath sounds: Normal breath sounds. No wheezing or rales.  Abdominal:     General: Bowel sounds are normal.     Palpations: Abdomen is soft. There is no mass.     Tenderness: There is no abdominal tenderness. There is no guarding or rebound.  Musculoskeletal: Normal range of motion.        General: No tenderness.  Lymphadenopathy:     Cervical: No cervical adenopathy.  Skin:    General: Skin is warm.     Findings: No rash.  Neurological:     Mental Status: He is alert and oriented to person, place, and time.     Cranial Nerves: No cranial nerve deficit.     Deep Tendon Reflexes: Reflexes are normal and symmetric.     BP 122/80   Pulse 60   Ht 5\' 7"  (1.702 m)   Wt 167 lb (75.8 kg)   BMI 26.16 kg/m   Assessment and Plan:  1. Annual physical exam No subjective/objective concerns noted during history or physical exam.  Patient's previous visits, labs, imaging, and referrals were reviewed.Kevin Oneal is a 78 y.o. male who presents today for his Complete Annual Exam. He feels well. He reports exercising regularly. He reports he is sleeping well.  Will repeat lipid panel renal panel and hepatic panel. - Lipid panel - Renal Function Panel - Hepatic Function Panel (6)  2. Pure hypercholesterolemia Patient with elevated LDL in the past.  We will recheck lipid panel. - Lipid panel  3. Colon cancer screening Guaiac negative no rectal mass on exam. - POCT Occult Blood Stool

## 2018-08-04 ENCOUNTER — Ambulatory Visit (INDEPENDENT_AMBULATORY_CARE_PROVIDER_SITE_OTHER): Payer: Medicare Other

## 2018-08-04 VITALS — BP 120/80 | HR 64 | Temp 97.8°F | Resp 16 | Ht 67.0 in | Wt 169.4 lb

## 2018-08-04 DIAGNOSIS — Z0000 Encounter for general adult medical examination without abnormal findings: Secondary | ICD-10-CM | POA: Diagnosis not present

## 2018-08-04 LAB — RENAL FUNCTION PANEL
ALBUMIN: 4.3 g/dL (ref 3.7–4.7)
BUN / CREAT RATIO: 12 (ref 10–24)
BUN: 12 mg/dL (ref 8–27)
CO2: 22 mmol/L (ref 20–29)
Calcium: 9 mg/dL (ref 8.6–10.2)
Chloride: 105 mmol/L (ref 96–106)
Creatinine, Ser: 0.99 mg/dL (ref 0.76–1.27)
GFR, EST AFRICAN AMERICAN: 83 mL/min/{1.73_m2} (ref >59)
GFR, EST NON AFRICAN AMERICAN: 72 mL/min/{1.73_m2} (ref >59)
GLUCOSE: 88 mg/dL (ref 65–99)
Phosphorus: 2.7 mg/dL — ABNORMAL LOW (ref 2.8–4.1)
Potassium: 4.3 mmol/L (ref 3.5–5.2)
Sodium: 141 mmol/L (ref 134–144)

## 2018-08-04 LAB — LIPID PANEL
CHOLESTEROL TOTAL: 192 mg/dL (ref 100–199)
Chol/HDL Ratio: 3.8 ratio (ref 0.0–5.0)
HDL: 50 mg/dL (ref >39)
LDL CALC: 123 mg/dL — AB (ref 0–99)
Triglycerides: 96 mg/dL (ref 0–149)
VLDL CHOLESTEROL CAL: 19 mg/dL (ref 5–40)

## 2018-08-04 LAB — HEPATIC FUNCTION PANEL (6)
ALK PHOS: 96 IU/L (ref 39–117)
ALT: 20 IU/L (ref 0–44)
AST: 23 IU/L (ref 0–40)
Bilirubin Total: 0.9 mg/dL (ref 0.0–1.2)
Bilirubin, Direct: 0.23 mg/dL (ref 0.00–0.40)

## 2018-08-04 NOTE — Patient Instructions (Signed)
Kevin Oneal , Thank you for taking time to come for your Medicare Wellness Visit. I appreciate your ongoing commitment to your health goals. Please review the following plan we discussed and let me know if I can assist you in the future.   Screening recommendations/referrals:  Recommended yearly ophthalmology/optometry visit for glaucoma screening and checkup Recommended yearly dental visit for hygiene and checkup  Vaccinations: Influenza vaccine: done 04/06/18 Pneumococcal vaccine: PCV 13 done 05/28/2016 PPSV23 done 07/30/17 Tdap vaccine: done 03/26/15 Shingles vaccine: Shingrix discussed. Please contact your pharmacy for coverage information.   Advanced directives: Advance directive discussed with you today. I have provided a copy for you to complete at home and have notarized. Once this is complete please bring a copy in to our office so we can scan it into your chart.  Conditions/risks identified: Recommend continue healthy eating and physical activity.   Next appointment: Please follow up in one year for your Medicare Annual Wellness visit.    Preventive Care 12 Years and Older, Male Preventive care refers to lifestyle choices and visits with your health care provider that can promote health and wellness. What does preventive care include?  A yearly physical exam. This is also called an annual well check.  Dental exams once or twice a year.  Routine eye exams. Ask your health care provider how often you should have your eyes checked.  Personal lifestyle choices, including:  Daily care of your teeth and gums.  Regular physical activity.  Eating a healthy diet.  Avoiding tobacco and drug use.  Limiting alcohol use.  Practicing safe sex.  Taking low doses of aspirin every day.  Taking vitamin and mineral supplements as recommended by your health care provider. What happens during an annual well check? The services and screenings done by your health care provider during  your annual well check will depend on your age, overall health, lifestyle risk factors, and family history of disease. Counseling  Your health care provider may ask you questions about your:  Alcohol use.  Tobacco use.  Drug use.  Emotional well-being.  Home and relationship well-being.  Sexual activity.  Eating habits.  History of falls.  Memory and ability to understand (cognition).  Work and work Statistician. Screening  You may have the following tests or measurements:  Height, weight, and BMI.  Blood pressure.  Lipid and cholesterol levels. These may be checked every 5 years, or more frequently if you are over 73 years old.  Skin check.  Lung cancer screening. You may have this screening every year starting at age 36 if you have a 30-pack-year history of smoking and currently smoke or have quit within the past 15 years.  Fecal occult blood test (FOBT) of the stool. You may have this test every year starting at age 36.  Flexible sigmoidoscopy or colonoscopy. You may have a sigmoidoscopy every 5 years or a colonoscopy every 10 years starting at age 31.  Prostate cancer screening. Recommendations will vary depending on your family history and other risks.  Hepatitis C blood test.  Hepatitis B blood test.  Sexually transmitted disease (STD) testing.  Diabetes screening. This is done by checking your blood sugar (glucose) after you have not eaten for a while (fasting). You may have this done every 1-3 years.  Abdominal aortic aneurysm (AAA) screening. You may need this if you are a current or former smoker.  Osteoporosis. You may be screened starting at age 36 if you are at high risk. Talk with your  health care provider about your test results, treatment options, and if necessary, the need for more tests. Vaccines  Your health care provider may recommend certain vaccines, such as:  Influenza vaccine. This is recommended every year.  Tetanus, diphtheria, and  acellular pertussis (Tdap, Td) vaccine. You may need a Td booster every 10 years.  Zoster vaccine. You may need this after age 58.  Pneumococcal 13-valent conjugate (PCV13) vaccine. One dose is recommended after age 53.  Pneumococcal polysaccharide (PPSV23) vaccine. One dose is recommended after age 65. Talk to your health care provider about which screenings and vaccines you need and how often you need them. This information is not intended to replace advice given to you by your health care provider. Make sure you discuss any questions you have with your health care provider. Document Released: 06/22/2015 Document Revised: 02/13/2016 Document Reviewed: 03/27/2015 Elsevier Interactive Patient Education  2017 [place] Prevention in the Home Falls can cause injuries. They can happen to people of all ages. There are many things you can do to make your home safe and to help prevent falls. What can I do on the outside of my home?  Regularly fix the edges of walkways and driveways and fix any cracks.  Remove anything that might make you trip as you walk through a door, such as a raised step or threshold.  Trim any bushes or trees on the path to your home.  Use bright outdoor lighting.  Clear any walking paths of anything that might make someone trip, such as rocks or tools.  Regularly check to see if handrails are loose or broken. Make sure that both sides of any steps have handrails.  Any raised decks and porches should have guardrails on the edges.  Have any leaves, snow, or ice cleared regularly.  Use sand or salt on walking paths during winter.  Clean up any spills in your garage right away. This includes oil or grease spills. What can I do in the bathroom?  Use night lights.  Install grab bars by the toilet and in the tub and shower. Do not use towel bars as grab bars.  Use non-skid mats or decals in the tub or shower.  If you need to sit down in the shower, use  a plastic, non-slip stool.  Keep the floor dry. Clean up any water that spills on the floor as soon as it happens.  Remove soap buildup in the tub or shower regularly.  Attach bath mats securely with double-sided non-slip rug tape.  Do not have throw rugs and other things on the floor that can make you trip. What can I do in the bedroom?  Use night lights.  Make sure that you have a light by your bed that is easy to reach.  Do not use any sheets or blankets that are too big for your bed. They should not hang down onto the floor.  Have a firm chair that has side arms. You can use this for support while you get dressed.  Do not have throw rugs and other things on the floor that can make you trip. What can I do in the kitchen?  Clean up any spills right away.  Avoid walking on wet floors.  Keep items that you use a lot in easy-to-reach places.  If you need to reach something above you, use a strong step stool that has a grab bar.  Keep electrical cords out of the way.  Do not use  floor polish or wax that makes floors slippery. If you must use wax, use non-skid floor wax.  Do not have throw rugs and other things on the floor that can make you trip. What can I do with my stairs?  Do not leave any items on the stairs.  Make sure that there are handrails on both sides of the stairs and use them. Fix handrails that are broken or loose. Make sure that handrails are as long as the stairways.  Check any carpeting to make sure that it is firmly attached to the stairs. Fix any carpet that is loose or worn.  Avoid having throw rugs at the top or bottom of the stairs. If you do have throw rugs, attach them to the floor with carpet tape.  Make sure that you have a light switch at the top of the stairs and the bottom of the stairs. If you do not have them, ask someone to add them for you. What else can I do to help prevent falls?  Wear shoes that:  Do not have high heels.  Have  rubber bottoms.  Are comfortable and fit you well.  Are closed at the toe. Do not wear sandals.  If you use a stepladder:  Make sure that it is fully opened. Do not climb a closed stepladder.  Make sure that both sides of the stepladder are locked into place.  Ask someone to hold it for you, if possible.  Clearly mark and make sure that you can see:  Any grab bars or handrails.  First and last steps.  Where the edge of each step is.  Use tools that help you move around (mobility aids) if they are needed. These include:  Canes.  Walkers.  Scooters.  Crutches.  Turn on the lights when you go into a dark area. Replace any light bulbs as soon as they burn out.  Set up your furniture so you have a clear path. Avoid moving your furniture around.  If any of your floors are uneven, fix them.  If there are any pets around you, be aware of where they are.  Review your medicines with your doctor. Some medicines can make you feel dizzy. This can increase your chance of falling. Ask your doctor what other things that you can do to help prevent falls. This information is not intended to replace advice given to you by your health care provider. Make sure you discuss any questions you have with your health care provider. Document Released: 03/22/2009 Document Revised: 11/01/2015 Document Reviewed: 06/30/2014 Elsevier Interactive Patient Education  2017 [employer].

## 2018-08-04 NOTE — Progress Notes (Signed)
Subjective:   Kevin Oneal is a 78 y.o. male who presents for Medicare Annual/Subsequent preventive examination.  Review of Systems:   Cardiac Risk Factors include: advanced age (>47men, >66 women);male gender     Objective:    Vitals: BP 120/80 (BP Location: Left Arm, Patient Position: Sitting, Cuff Size: Normal)   Pulse 64   Temp 97.8 F (36.6 C) (Oral)   Resp 16   Ht 5\' 7"  (1.702 m)   Wt 169 lb 6.4 oz (76.8 kg)   SpO2 97%   BMI 26.53 kg/m   Body mass index is 26.53 kg/m.  Advanced Directives 08/04/2018 07/30/2017  Does Patient Have a Medical Advance Directive? No No  Would patient like information on creating a medical advance directive? Yes (MAU/Ambulatory/Procedural Areas - Information given) Yes (MAU/Ambulatory/Procedural Areas - Information given)    Tobacco Social History   Tobacco Use  Smoking Status Former Smoker  . Packs/day: 0.50  . Years: 11.00  . Pack years: 5.50  . Types: Cigarettes  . Last attempt to quit: 1980  . Years since quitting: 40.1  Smokeless Tobacco Never Used  Tobacco Comment   smoking cessation materials not required     Counseling given: Not Answered Comment: smoking cessation materials not required   Clinical Intake:  Pre-visit preparation completed: Yes  Pain : No/denies pain     BMI - recorded: 26.53 Nutritional Status: BMI 25 -29 Overweight Nutritional Risks: None Diabetes: No  How often do you need to have someone help you when you read instructions, pamphlets, or other written materials from your doctor or pharmacy?: 1 - Never What is the last grade level you completed in school?: 12th grade  Interpreter Needed?: No  Information entered by :: Clemetine Marker LPN  Past Medical History:  Diagnosis Date  . Cancer [facility])    prostate   Past Surgical History:  Procedure Laterality Date  . COLONOSCOPY  06/10/2011   Dr Candace Cruise- normal  . PROSTATE SURGERY     removal of prostate due to cancer  . REPLACEMENT TOTAL KNEE  BILATERAL     Family History  Problem Relation Age of Onset  . Cancer Father   . Diabetes Mother   . Diabetes Brother    Social History   Socioeconomic History  . Marital status: Divorced    Spouse name: Not on file  . Number of children: 3  . Years of education: some college  . Highest education level: 12th grade  Occupational History  . Occupation: Retired  Scientific laboratory technician  . Financial resource strain: Not hard at all  . Food insecurity:    Worry: Never true    Inability: Never true  . Transportation needs:    Medical: No    Non-medical: No  Tobacco Use  . Smoking status: Former Smoker    Packs/day: 0.50    Years: 11.00    Pack years: 5.50    Types: Cigarettes    Last attempt to quit: 1980    Years since quitting: 40.1  . Smokeless tobacco: Never Used  . Tobacco comment: smoking cessation materials not required  Substance and Sexual Activity  . Alcohol use: Yes    Alcohol/week: 7.0 standard drinks    Types: 7 Shots of liquor per week  . Drug use: No  . Sexual activity: Yes  Lifestyle  . Physical activity:    Days per week: 3 days    Minutes per session: 90 min  . Stress: Not at all  Relationships  . Social connections:    Talks on phone: More than three times a week    Gets together: More than three times a week    Attends religious service: More than 4 times per year    Active member of club or organization: Yes    Attends meetings of clubs or organizations: 1 to 4 times per year    Relationship status: Divorced  Other Topics Concern  . Not on file  Social History Narrative  . Not on file    Outpatient Encounter Medications as of 08/04/2018  Medication Sig  . aspirin EC 81 MG tablet Take 81 mg by mouth daily.  . meloxicam (MOBIC) 15 MG tablet Take 1 tablet by mouth daily.  . Multiple Vitamins-Minerals (ONE-A-DAY MENS 50+ ADVANTAGE PO) Take 1 tablet by mouth daily.  . niacin 500 MG tablet Take 1 tablet by mouth daily.  . Omega-3 1000 MG CAPS Take 2  capsules by mouth daily.  . predniSONE (DELTASONE) 10 MG tablet Plans to get a refill   No facility-administered encounter medications on file as of 08/04/2018.     Activities of Daily Living In your present state of health, do you have any difficulty performing the following activities: 08/04/2018  Hearing? Y  Comment wears hearing aids  Vision? Y  Comment beginning stages of cataracts and wears contacts and reading glasses  Difficulty concentrating or making decisions? N  Walking or climbing stairs? N  Dressing or bathing? N  Doing errands, shopping? N  Preparing Food and eating ? N  Using the Toilet? N  In the past six months, have you accidently leaked urine? Y  Comment only with urgency from prostate removal  Do you have problems with loss of bowel control? N  Managing your Medications? N  Managing your Finances? N  Housekeeping or managing your Housekeeping? N  Some recent data might be hidden    Patient Care Team: Juline Patch, MD as PCP - General (Family Medicine)   Assessment:   This is a routine wellness examination for [place].  Exercise Activities and Dietary recommendations Current Exercise Habits: Structured exercise class, Type of exercise: strength training/weights;calisthenics;walking(3), Time (Minutes): > 60(90), Frequency (Times/Week): 3, Weekly Exercise (Minutes/Week): 0, Intensity: Moderate, Exercise limited by: None identified  Goals    . DIET - INCREASE WATER INTAKE     Recommend to drink at least 6-8 8oz glasses of water per day.    . Patient Stated     Pt states he would like take care of left should and hand.        Fall Risk Fall Risk  08/04/2018 08/03/2018 07/30/2017 07/30/2017 05/28/2016  Falls in the past year? 0 0 No No No  Number falls in past yr: 0 - - - -  Injury with Fall? 0 - - - -  Follow up Falls prevention discussed Falls evaluation completed - - -   FALL RISK PREVENTION PERTAINING TO THE HOME:  Any stairs in or around the  home? Yes  If so, do they handrails? Yes   Home free of loose throw rugs in walkways, pet beds, electrical cords, etc? Yes  Adequate lighting in your home to reduce risk of falls? Yes   ASSISTIVE DEVICES UTILIZED TO PREVENT FALLS:  Life alert? No  Use of a cane, walker or w/c? No  Grab bars in the bathroom? Yes  Shower chair or bench in shower? Yes  Elevated toilet seat or a handicapped toilet? No  DME ORDERS:  DME order needed?  No   TIMED UP AND GO:  Was the test performed? Yes .  Length of time to ambulate 10 feet: 5 sec.   GAIT:  Appearance of gait: Gait stead-fast and without the use of an assistive device.  Education: Fall risk prevention has been discussed.  Intervention(s) required? No   Depression Screen PHQ 2/9 Scores 08/04/2018 08/03/2018 07/30/2017 07/30/2017  PHQ - 2 Score 0 0 0 1  PHQ- 9 Score 0 0 0 2    Cognitive Function     6CIT Screen 08/04/2018 07/30/2017  What Year? 0 points 0 points  What month? 0 points 0 points  What time? 0 points 0 points  Count back from 20 0 points 0 points  Months in reverse 0 points 0 points  Repeat phrase 2 points 0 points  Total Score 2 0    Immunization History  Administered Date(s) Administered  . Influenza, High Dose Seasonal PF 04/06/2018  . Pneumococcal Conjugate-13 05/28/2016  . Pneumococcal Polysaccharide-23 07/30/2017  . Td 03/26/2015  . Zoster 03/26/2015    Qualifies for Shingles Vaccine? Yes  Zostavax completed 2016. Due for Shingrix. Education has been provided regarding the importance of this vaccine. Pt has been advised to call insurance company to determine out of pocket expense. Advised may also receive vaccine at local pharmacy or Health Dept. Verbalized acceptance and understanding.  Tdap: Up to date  Flu Vaccine: Up to date  Pneumococcal Vaccine: Up to date   Screening Tests Health Maintenance  Topic Date Due  . TETANUS/TDAP  03/25/2025  . INFLUENZA VACCINE  Completed  . PNA vac Low  Risk Adult  Completed   Cancer Screenings:  Colorectal Screening: No longer required.   Lung Cancer Screening: (Low Dose CT Chest recommended if Age 44-80 years, 30 pack-year currently smoking OR have quit w/in 15years.) does not qualify.   Additional Screening:  Hepatitis C Screening: no longer required  Vision Screening: Recommended annual ophthalmology exams for early detection of glaucoma and other disorders of the eye. Is the patient up to date with their annual eye exam?  Yes  Who is the provider or what is the name of the office in which the pt attends annual eye exams? [place] Screening: Recommended annual dental exams for proper oral hygiene  Community Resource Referral:  CRR required this visit?  No       Plan:    I have personally reviewed and addressed the Medicare Annual Wellness questionnaire and have noted the following in the patient's chart:  A. Medical and social history B. Use of alcohol, tobacco or illicit drugs  C. Current medications and supplements D. Functional ability and status E.  Nutritional status F.  Physical activity G. Advance directives H. List of other physicians I.  Hospitalizations, surgeries, and ER visits in previous 12 months J.  [place] such as hearing and vision if needed, cognitive and depression L. Referrals and appointments   In addition, I have reviewed and discussed with patient certain preventive protocols, quality metrics, and best practice recommendations. A written personalized care plan for preventive services as well as general preventive health recommendations were provided to patient.   Signed,  Clemetine Marker, LPN Nurse Health Advisor   Nurse Notes: none.

## 2019-06-21 DIAGNOSIS — H2513 Age-related nuclear cataract, bilateral: Secondary | ICD-10-CM | POA: Diagnosis not present

## 2019-07-04 DIAGNOSIS — N5201 Erectile dysfunction due to arterial insufficiency: Secondary | ICD-10-CM | POA: Diagnosis not present

## 2019-07-04 DIAGNOSIS — Z8546 Personal history of malignant neoplasm of prostate: Secondary | ICD-10-CM | POA: Diagnosis not present

## 2019-08-08 ENCOUNTER — Ambulatory Visit (INDEPENDENT_AMBULATORY_CARE_PROVIDER_SITE_OTHER): Payer: Medicare PPO

## 2019-08-08 VITALS — Ht 66.0 in | Wt 170.0 lb

## 2019-08-08 DIAGNOSIS — Z0000 Encounter for general adult medical examination without abnormal findings: Secondary | ICD-10-CM | POA: Diagnosis not present

## 2019-08-08 NOTE — Patient Instructions (Signed)
Kevin Oneal , Thank you for taking time to come for your Medicare Wellness Visit. I appreciate your ongoing commitment to your health goals. Please review the following plan we discussed and let me know if I can assist you in the future.   Screening recommendations/referrals: Colonoscopy: no longer required Recommended yearly ophthalmology/optometry visit for glaucoma screening and checkup Recommended yearly dental visit for hygiene and checkup  Vaccinations: Influenza vaccine: done 03/18/19 Pneumococcal vaccine: done 07/30/17 Tdap vaccine: done 03/26/15 Shingles vaccine: 1st dose of Shingrix 03/29/19. Due for second dose.  Covid-19: 06/15/19, 07/06/19  Advanced directives: Please bring a copy of your health care power of attorney and living will to the office at your convenience once you have completed those documents.   Conditions/risks identified: Recommend increasing physical activity as tolerated.   Next appointment: Please follow up in one year for your Medicare Annual Wellness visit.    Preventive Care 14 Years and Older, Male Preventive care refers to lifestyle choices and visits with your health care provider that can promote health and wellness. What does preventive care include?  A yearly physical exam. This is also called an annual well check.  Dental exams once or twice a year.  Routine eye exams. Ask your health care provider how often you should have your eyes checked.  Personal lifestyle choices, including:  Daily care of your teeth and gums.  Regular physical activity.  Eating a healthy diet.  Avoiding tobacco and drug use.  Limiting alcohol use.  Practicing safe sex.  Taking low doses of aspirin every day.  Taking vitamin and mineral supplements as recommended by your health care provider. What happens during an annual well check? The services and screenings done by your health care provider during your annual well check will depend on your age, overall  health, lifestyle risk factors, and family history of disease. Counseling  Your health care provider may ask you questions about your:  Alcohol use.  Tobacco use.  Drug use.  Emotional well-being.  Home and relationship well-being.  Sexual activity.  Eating habits.  History of falls.  Memory and ability to understand (cognition).  Work and work Statistician. Screening  You may have the following tests or measurements:  Height, weight, and BMI.  Blood pressure.  Lipid and cholesterol levels. These may be checked every 5 years, or more frequently if you are over 70 years old.  Skin check.  Lung cancer screening. You may have this screening every year starting at age 23 if you have a 30-pack-year history of smoking and currently smoke or have quit within the past 15 years.  Fecal occult blood test (FOBT) of the stool. You may have this test every year starting at age 65.  Flexible sigmoidoscopy or colonoscopy. You may have a sigmoidoscopy every 5 years or a colonoscopy every 10 years starting at age 74.  Prostate cancer screening. Recommendations will vary depending on your family history and other risks.  Hepatitis C blood test.  Hepatitis B blood test.  Sexually transmitted disease (STD) testing.  Diabetes screening. This is done by checking your blood sugar (glucose) after you have not eaten for a while (fasting). You may have this done every 1-3 years.  Abdominal aortic aneurysm (AAA) screening. You may need this if you are a current or former smoker.  Osteoporosis. You may be screened starting at age 11 if you are at high risk. Talk with your health care provider about your test results, treatment options, and if necessary, the  need for more tests. Vaccines  Your health care provider may recommend certain vaccines, such as:  Influenza vaccine. This is recommended every year.  Tetanus, diphtheria, and acellular pertussis (Tdap, Td) vaccine. You may need a Td  booster every 10 years.  Zoster vaccine. You may need this after age 13.  Pneumococcal 13-valent conjugate (PCV13) vaccine. One dose is recommended after age 38.  Pneumococcal polysaccharide (PPSV23) vaccine. One dose is recommended after age 44. Talk to your health care provider about which screenings and vaccines you need and how often you need them. This information is not intended to replace advice given to you by your health care provider. Make sure you discuss any questions you have with your health care provider. Document Released: 06/22/2015 Document Revised: 02/13/2016 Document Reviewed: 03/27/2015 Elsevier Interactive Patient Education  2017 [place] Prevention in the Home Falls can cause injuries. They can happen to people of all ages. There are many things you can do to make your home safe and to help prevent falls. What can I do on the outside of my home?  Regularly fix the edges of walkways and driveways and fix any cracks.  Remove anything that might make you trip as you walk through a door, such as a raised step or threshold.  Trim any bushes or trees on the path to your home.  Use bright outdoor lighting.  Clear any walking paths of anything that might make someone trip, such as rocks or tools.  Regularly check to see if handrails are loose or broken. Make sure that both sides of any steps have handrails.  Any raised decks and porches should have guardrails on the edges.  Have any leaves, snow, or ice cleared regularly.  Use sand or salt on walking paths during winter.  Clean up any spills in your garage right away. This includes oil or grease spills. What can I do in the bathroom?  Use night lights.  Install grab bars by the toilet and in the tub and shower. Do not use towel bars as grab bars.  Use non-skid mats or decals in the tub or shower.  If you need to sit down in the shower, use a plastic, non-slip stool.  Keep the floor dry. Clean up  any water that spills on the floor as soon as it happens.  Remove soap buildup in the tub or shower regularly.  Attach bath mats securely with double-sided non-slip rug tape.  Do not have throw rugs and other things on the floor that can make you trip. What can I do in the bedroom?  Use night lights.  Make sure that you have a light by your bed that is easy to reach.  Do not use any sheets or blankets that are too big for your bed. They should not hang down onto the floor.  Have a firm chair that has side arms. You can use this for support while you get dressed.  Do not have throw rugs and other things on the floor that can make you trip. What can I do in the kitchen?  Clean up any spills right away.  Avoid walking on wet floors.  Keep items that you use a lot in easy-to-reach places.  If you need to reach something above you, use a strong step stool that has a grab bar.  Keep electrical cords out of the way.  Do not use floor polish or wax that makes floors slippery. If you must use wax,  use non-skid floor wax.  Do not have throw rugs and other things on the floor that can make you trip. What can I do with my stairs?  Do not leave any items on the stairs.  Make sure that there are handrails on both sides of the stairs and use them. Fix handrails that are broken or loose. Make sure that handrails are as long as the stairways.  Check any carpeting to make sure that it is firmly attached to the stairs. Fix any carpet that is loose or worn.  Avoid having throw rugs at the top or bottom of the stairs. If you do have throw rugs, attach them to the floor with carpet tape.  Make sure that you have a light switch at the top of the stairs and the bottom of the stairs. If you do not have them, ask someone to add them for you. What else can I do to help prevent falls?  Wear shoes that:  Do not have high heels.  Have rubber bottoms.  Are comfortable and fit you well.  Are  closed at the toe. Do not wear sandals.  If you use a stepladder:  Make sure that it is fully opened. Do not climb a closed stepladder.  Make sure that both sides of the stepladder are locked into place.  Ask someone to hold it for you, if possible.  Clearly mark and make sure that you can see:  Any grab bars or handrails.  First and last steps.  Where the edge of each step is.  Use tools that help you move around (mobility aids) if they are needed. These include:  Canes.  Walkers.  Scooters.  Crutches.  Turn on the lights when you go into a dark area. Replace any light bulbs as soon as they burn out.  Set up your furniture so you have a clear path. Avoid moving your furniture around.  If any of your floors are uneven, fix them.  If there are any pets around you, be aware of where they are.  Review your medicines with your doctor. Some medicines can make you feel dizzy. This can increase your chance of falling. Ask your doctor what other things that you can do to help prevent falls. This information is not intended to replace advice given to you by your health care provider. Make sure you discuss any questions you have with your health care provider. Document Released: 03/22/2009 Document Revised: 11/01/2015 Document Reviewed: 06/30/2014 Elsevier Interactive Patient Education  2017 [employer].

## 2019-08-08 NOTE — Progress Notes (Addendum)
Subjective:   Kevin Oneal is a 79 y.o. male who presents for Medicare Annual/Subsequent preventive examination.  Virtual Visit via Telephone Note  I connected with Kevin Oneal on 08/08/19 at  2:40 PM EST by telephone and verified that I am speaking with the correct person using two identifiers.  Medicare Annual Wellness visit completed telephonically due to Covid-19 pandemic.   Location: Patient: home Provider: office   I discussed the limitations, risks, security and privacy concerns of performing an evaluation and management service by telephone and the availability of in person appointments. The patient expressed understanding and agreed to proceed.  Some vital signs may be absent or patient reported.   Clemetine Marker, LPN     Review of Systems:   Cardiac Risk Factors include: advanced age (>95men, >6 women);male gender     Objective:    Vitals: Ht 5\' 6"  (1.676 m)   Wt 170 lb (77.1 kg)   BMI 27.44 kg/m   Body mass index is 27.44 kg/m.  Advanced Directives 08/08/2019 08/04/2018 07/30/2017  Does Patient Have a Medical Advance Directive? No No No  Does patient want to make changes to medical advance directive? No - Patient declined - -  Would patient like information on creating a medical advance directive? - Yes (MAU/Ambulatory/Procedural Areas - Information given) Yes (MAU/Ambulatory/Procedural Areas - Information given)    Tobacco Social History   Tobacco Use  Smoking Status Former Smoker  . Packs/day: 0.50  . Years: 11.00  . Pack years: 5.50  . Types: Cigarettes  . Quit date: 20  . Years since quitting: 41.1  Smokeless Tobacco Never Used  Tobacco Comment   smoking cessation materials not required     Counseling given: Not Answered Comment: smoking cessation materials not required   Clinical Intake:  Pre-visit preparation completed: Yes  Pain : 0-10 Pain Score: 3  Pain Type: Chronic pain Pain Location: Back Pain Orientation: Lower Pain  Descriptors / Indicators: Aching, Sore Pain Onset: More than a month ago Pain Frequency: Constant     BMI - recorded: 27.44 Nutritional Status: BMI 25 -29 Overweight Nutritional Risks: None Diabetes: No  How often do you need to have someone help you when you read instructions, pamphlets, or other written materials from your doctor or pharmacy?: 1 - Never  Interpreter Needed?: No  Information entered by :: Clemetine Marker LPN  Past Medical History:  Diagnosis Date  . Cancer [facility])    prostate   Past Surgical History:  Procedure Laterality Date  . COLONOSCOPY  06/10/2011   Dr Candace Cruise- normal  . PROSTATE SURGERY     removal of prostate due to cancer  . REPLACEMENT TOTAL KNEE BILATERAL     Family History  Problem Relation Age of Onset  . Cancer Father   . Diabetes Mother   . Diabetes Brother    Social History   Socioeconomic History  . Marital status: Divorced    Spouse name: Not on file  . Number of children: 3  . Years of education: some college  . Highest education level: 12th grade  Occupational History  . Occupation: Retired  Tobacco Use  . Smoking status: Former Smoker    Packs/day: 0.50    Years: 11.00    Pack years: 5.50    Types: Cigarettes    Quit date: 1980    Years since quitting: 41.1  . Smokeless tobacco: Never Used  . Tobacco comment: smoking cessation materials not required  Substance and Sexual Activity  .  Alcohol use: Yes    Alcohol/week: 7.0 standard drinks    Types: 7 Shots of liquor per week  . Drug use: No  . Sexual activity: Yes  Other Topics Concern  . Not on file  Social History Narrative  . Not on file   Social Determinants of Health   Financial Resource Strain: Low Risk   . Difficulty of Paying Living Expenses: Not hard at all  Food Insecurity: No Food Insecurity  . Worried About Charity fundraiser in the Last Year: Never true  . Ran Out of Food in the Last Year: Never true  Transportation Needs: No Transportation Needs  . Lack  of Transportation (Medical): No  . Lack of Transportation (Non-Medical): No  Physical Activity: Insufficiently Active  . Days of Exercise per Week: 7 days  . Minutes of Exercise per Session: 10 min  Stress: Stress Concern Present  . Feeling of Stress : To some extent  Social Connections: Not Isolated  . Frequency of Communication with Friends and Family: More than three times a week  . Frequency of Social Gatherings with Friends and Family: More than three times a week  . Attends Religious Services: More than 4 times per year  . Active Member of Clubs or Organizations: Yes  . Attends Archivist Meetings: 1 to 4 times per year  . Marital Status: Living with partner    Outpatient Encounter Medications as of 08/08/2019  Medication Sig  . ibuprofen (ADVIL) 200 MG tablet Take by mouth.  . Multiple Vitamins-Minerals (ONE-A-DAY MENS 50+ ADVANTAGE PO) Take 1 tablet by mouth daily.  . niacin 500 MG tablet Take 1 tablet by mouth daily.  . Omega-3 1000 MG CAPS Take 2 capsules by mouth daily.  . timolol (TIMOPTIC) 0.5 % ophthalmic solution 1 drop daily.  . [DISCONTINUED] aspirin EC 81 MG tablet Take 81 mg by mouth daily.  . [DISCONTINUED] predniSONE (DELTASONE) 10 MG tablet Plans to get a refill   No facility-administered encounter medications on file as of 08/08/2019.    Activities of Daily Living In your present state of health, do you have any difficulty performing the following activities: 08/08/2019  Hearing? Y  Comment wears hearing aids  Vision? N  Difficulty concentrating or making decisions? N  Walking or climbing stairs? N  Dressing or bathing? N  Doing errands, shopping? N  Preparing Food and eating ? N  Using the Toilet? N  In the past six months, have you accidently leaked urine? Y  Do you have problems with loss of bowel control? N  Managing your Medications? N  Managing your Finances? N  Housekeeping or managing your Housekeeping? N  Some recent data might be  hidden    Patient Care Team: Juline Patch, MD as PCP - General (Family Medicine)   Assessment:   This is a routine wellness examination for Kevin Oneal.  Exercise Activities and Dietary recommendations Current Exercise Habits: Home exercise routine, Type of exercise: calisthenics, Time (Minutes): 10, Frequency (Times/Week): 7, Weekly Exercise (Minutes/Week): 70, Intensity: Mild, Exercise limited by: orthopedic condition(s)  Goals    . DIET - INCREASE WATER INTAKE     Recommend to drink at least 6-8 8oz glasses of water per day.    . Patient Stated     Pt states he would like take care of left shoulder and hand.   Pt had carpal tunnel surgery.        Fall Risk Fall Risk  08/08/2019 08/04/2018 08/03/2018 07/30/2017  07/30/2017  Falls in the past year? 0 0 0 No No  Number falls in past yr: 0 0 - - -  Injury with Fall? 0 0 - - -  Risk for fall due to : Orthopedic patient - - - -  Follow up Falls prevention discussed Falls prevention discussed Falls evaluation completed - -   FALL RISK PREVENTION PERTAINING TO THE HOME:  Any stairs in or around the home? Yes  If so, do they handrails? Yes   Home free of loose throw rugs in walkways, pet beds, electrical cords, etc? Yes  Adequate lighting in your home to reduce risk of falls? Yes   ASSISTIVE DEVICES UTILIZED TO PREVENT FALLS:  Life alert? No  Use of a cane, walker or w/c? No  Grab bars in the bathroom? Yes  Shower chair or bench in shower? Yes  Elevated toilet seat or a handicapped toilet? No   DME ORDERS:  DME order needed?  No   TIMED UP AND GO:  Was the test performed? No . Telephonic visit.   Education: Fall risk prevention has been discussed.  Intervention(s) required? No   Depression Screen PHQ 2/9 Scores 08/08/2019 08/04/2018 08/03/2018 07/30/2017  PHQ - 2 Score 0 0 0 0  PHQ- 9 Score - 0 0 0    Cognitive Function - pt declined 6CIT for 2021 AWV     6CIT Screen 08/04/2018 07/30/2017  What Year? 0 points 0 points    What month? 0 points 0 points  What time? 0 points 0 points  Count back from 20 0 points 0 points  Months in reverse 0 points 0 points  Repeat phrase 2 points 0 points  Total Score 2 0    Immunization History  Administered Date(s) Administered  . Influenza, High Dose Seasonal PF 04/06/2018  . Influenza-Unspecified 03/18/2019  . PFIZER SARS-COV-2 Vaccination 06/15/2019, 07/06/2019  . Pneumococcal Conjugate-13 05/28/2016  . Pneumococcal Polysaccharide-23 07/30/2017  . Td 03/26/2015  . Zoster 03/26/2015  . Zoster Recombinat (Shingrix) 03/29/2019    Qualifies for Shingles Vaccine? Yes  Zostavax . Due for second dose of Shingrix.   Tdap: Up to date  Flu Vaccine: Up to date  Pneumococcal Vaccine: Up to date   Screening Tests Health Maintenance  Topic Date Due  . TETANUS/TDAP  03/25/2025  . INFLUENZA VACCINE  Completed  . PNA vac Low Risk Adult  Completed   Cancer Screenings:  Colorectal Screening: No longer required.  Lung Cancer Screening: (Low Dose CT Chest recommended if Age 37-80 years, 30 pack-year currently smoking OR have quit w/in 15years.) does not qualify.    Additional Screening:  Hepatitis C Screening: no longer required   Vision Screening: Recommended annual ophthalmology exams for early detection of glaucoma and other disorders of the eye. Is the patient up to date with their annual eye exam?  Yes  Who is the provider or what is the name of the office in which the pt attends annual eye exams? Dr. Thomasene Ripple  Dental Screening: Recommended annual dental exams for proper oral hygiene  Community Resource Referral:  CRR required this visit?  No       Plan:    I have personally reviewed and addressed the Medicare Annual Wellness questionnaire and have noted the following in the patient's chart:  A. Medical and social history B. Use of alcohol, tobacco or illicit drugs  C. Current medications and supplements D. Functional ability and status E.   Nutritional status F.  Physical activity  G. Advance directives H. List of other physicians I.  Hospitalizations, surgeries, and ER visits in previous 12 months J.  [place] such as hearing and vision if needed, cognitive and depression L. Referrals and appointments   In addition, I have reviewed and discussed with patient certain preventive protocols, quality metrics, and best practice recommendations. A written personalized care plan for preventive services as well as general preventive health recommendations were provided to patient.   Signed,  Clemetine Marker, LPN Nurse Health Advisor   Nurse Notes: none

## 2019-08-09 ENCOUNTER — Other Ambulatory Visit: Payer: Self-pay

## 2019-08-09 ENCOUNTER — Encounter: Payer: Self-pay | Admitting: Family Medicine

## 2019-08-09 ENCOUNTER — Ambulatory Visit (INDEPENDENT_AMBULATORY_CARE_PROVIDER_SITE_OTHER): Payer: Medicare PPO | Admitting: Family Medicine

## 2019-08-09 VITALS — BP 140/80 | HR 60 | Ht 66.0 in | Wt 174.0 lb

## 2019-08-09 DIAGNOSIS — E7800 Pure hypercholesterolemia, unspecified: Secondary | ICD-10-CM | POA: Diagnosis not present

## 2019-08-09 DIAGNOSIS — Z0000 Encounter for general adult medical examination without abnormal findings: Secondary | ICD-10-CM

## 2019-08-09 NOTE — Progress Notes (Signed)
Date:  08/09/2019   Name:  Kevin Oneal   DOB:  [birth year]   MRN:  [id number]   Chief Complaint: Annual Exam (phQ9=1 and GAD7=5)  Patient is a 79 year old male who presents for a comprehensive physical exam. The patient reports the following problems: back concern. Health maintenance has been reviewed up to date.   Lab Results  Component Value Date   CREATININE 0.99 08/03/2018   BUN 12 08/03/2018   NA 141 08/03/2018   K 4.3 08/03/2018   CL 105 08/03/2018   CO2 22 08/03/2018   Lab Results  Component Value Date   CHOL 192 08/03/2018   HDL 50 08/03/2018   LDLCALC 123 (H) 08/03/2018   TRIG 96 08/03/2018   CHOLHDL 3.8 08/03/2018   No results found for: TSH No results found for: HGBA1C   Review of Systems  Constitutional: Negative for chills and fever.  HENT: Negative for drooling, ear discharge, ear pain and sore throat.   Respiratory: Negative for cough, shortness of breath and wheezing.   Cardiovascular: Negative for chest pain, palpitations and leg swelling.  Gastrointestinal: Negative for abdominal pain, blood in stool, constipation, diarrhea and nausea.  Endocrine: Negative for polydipsia.  Genitourinary: Negative for dysuria, frequency, hematuria and urgency.  Musculoskeletal: Negative for back pain, myalgias and neck pain.  Skin: Negative for rash.  Allergic/Immunologic: Negative for environmental allergies.  Neurological: Negative for dizziness and headaches.  Hematological: Does not bruise/bleed easily.  Psychiatric/Behavioral: Negative for suicidal ideas. The patient is not nervous/anxious.     Patient Active Problem List   Diagnosis Date Noted  . Rotator cuff syndrome of left shoulder 06/30/2018  . Primary osteoarthritis of first carpometacarpal joint of right hand 09/08/2017  . Hand pain, right 09/08/2017  . Pure hypercholesterolemia 07/30/2017  . OA (osteoarthritis) of knee 10/05/2013  . Personal history of other malignant neoplasm of skin 02/28/2013   . Barrett's esophagus 03/17/2012    No Known Allergies  Past Surgical History:  Procedure Laterality Date  . COLONOSCOPY  06/10/2011   Dr Candace Cruise- normal  . PROSTATE SURGERY     removal of prostate due to cancer  . REPLACEMENT TOTAL KNEE BILATERAL      Social History   Tobacco Use  . Smoking status: Former Smoker    Packs/day: 0.50    Years: 11.00    Pack years: 5.50    Types: Cigarettes    Quit date: 1980    Years since quitting: 41.1  . Smokeless tobacco: Never Used  . Tobacco comment: smoking cessation materials not required  Substance Use Topics  . Alcohol use: Yes    Alcohol/week: 7.0 standard drinks    Types: 7 Shots of liquor per week  . Drug use: No     Medication list has been reviewed and updated.  Current Meds  Medication Sig  . ibuprofen (ADVIL) 200 MG tablet Take by mouth.  . Multiple Vitamins-Minerals (ONE-A-DAY MENS 50+ ADVANTAGE PO) Take 1 tablet by mouth daily.  . niacin 500 MG tablet Take 1 tablet by mouth daily.  . Omega-3 1000 MG CAPS Take 2 capsules by mouth daily.  . timolol (TIMOPTIC) 0.5 % ophthalmic solution 1 drop daily.    PHQ 2/9 Scores 08/09/2019 08/08/2019 08/04/2018 08/03/2018  PHQ - 2 Score 0 0 0 0  PHQ- 9 Score 1 - 0 0    BP Readings from Last 3 Encounters:  08/09/19 140/80  08/04/18 120/80  08/03/18 122/80    Physical  Exam Vitals and nursing note reviewed.  Constitutional:      Appearance: Normal appearance. He is well-developed and well-groomed.  HENT:     Head: Normocephalic.     Jaw: There is normal jaw occlusion.     Right Ear: Tympanic membrane, ear canal and external ear normal. Decreased hearing noted. There is no impacted cerumen.     Left Ear: Tympanic membrane, ear canal and external ear normal. Decreased hearing noted. There is no impacted cerumen.     Nose: Nose normal. No congestion or rhinorrhea.     Mouth/Throat:     Lips: Pink.     Mouth: Mucous membranes are moist.     Dentition: Normal dentition.      Palate: No mass.     Pharynx: Oropharynx is clear. Uvula midline.  Eyes:     General: Vision grossly intact. Gaze aligned appropriately. No scleral icterus.       Right eye: No discharge.        Left eye: No discharge.     Extraocular Movements: Extraocular movements intact.     Conjunctiva/sclera: Conjunctivae normal.     Pupils: Pupils are equal, round, and reactive to light.  Neck:     Thyroid: No thyroid mass, thyromegaly or thyroid tenderness.     Vascular: Normal carotid pulses. No carotid bruit, hepatojugular reflux or JVD.     Trachea: Trachea and phonation normal. No tracheal deviation.  Cardiovascular:     Rate and Rhythm: Normal rate and regular rhythm.     Chest Wall: PMI is not displaced.     Pulses: Normal pulses.          Carotid pulses are 2+ on the right side and 2+ on the left side.      Radial pulses are 2+ on the right side and 2+ on the left side.       Femoral pulses are 2+ on the right side and 2+ on the left side.      Popliteal pulses are 2+ on the right side and 2+ on the left side.       Dorsalis pedis pulses are 2+ on the right side and 2+ on the left side.       Posterior tibial pulses are 2+ on the right side and 2+ on the left side.     Heart sounds: Normal heart sounds, S1 normal and S2 normal. No murmur. No systolic murmur. No diastolic murmur. No friction rub. No gallop. No S3 or S4 sounds.   Pulmonary:     Effort: No respiratory distress.     Breath sounds: Normal breath sounds. No decreased breath sounds, wheezing, rhonchi or rales.  Chest:     Chest wall: No tenderness.     Breasts: Breasts are symmetrical.        Right: Normal. No mass.        Left: Normal. No mass.  Abdominal:     General: Bowel sounds are normal. There is no distension.     Palpations: Abdomen is soft. There is no hepatomegaly, splenomegaly or mass.     Tenderness: There is no abdominal tenderness. There is no guarding or rebound.     Hernia: A hernia is present. Hernia is  present in the ventral area. There is no hernia in the umbilical area.  Genitourinary:    Testes: Normal.        Right: Mass not present.        Left: Mass not present.  Comments: Mild atrophy Musculoskeletal:        General: No tenderness. Normal range of motion.     Cervical back: Normal, full passive range of motion without pain, normal range of motion and neck supple.     Thoracic back: Normal.     Lumbar back: Normal.     Right lower leg: No edema.     Left lower leg: No edema.  Lymphadenopathy:     Head:     Right side of head: No submandibular adenopathy.     Left side of head: No submandibular adenopathy.     Cervical: No cervical adenopathy.     Right cervical: No superficial, deep or posterior cervical adenopathy.    Left cervical: No superficial, deep or posterior cervical adenopathy.     Upper Body:     Right upper body: No supraclavicular or axillary adenopathy.     Left upper body: No supraclavicular adenopathy.  Skin:    General: Skin is warm.     Findings: No rash.  Neurological:     Mental Status: He is alert and oriented to person, place, and time.     Cranial Nerves: No cranial nerve deficit.     Sensory: Sensation is intact. No sensory deficit.     Motor: Motor function is intact.     Deep Tendon Reflexes: Reflexes are normal and symmetric.     Reflex Scores:      Tricep reflexes are 2+ on the right side and 2+ on the left side.      Bicep reflexes are 2+ on the right side and 2+ on the left side.      Brachioradialis reflexes are 2+ on the right side and 2+ on the left side.      Patellar reflexes are 2+ on the right side and 2+ on the left side.      Achilles reflexes are 2+ on the right side and 2+ on the left side. Psychiatric:        Behavior: Behavior is cooperative.     Wt Readings from Last 3 Encounters:  08/09/19 174 lb (78.9 kg)  08/08/19 170 lb (77.1 kg)  08/04/18 169 lb 6.4 oz (76.8 kg)    BP 140/80   Pulse 60   Ht 5\' 6"  (1.676 m)    Wt 174 lb (78.9 kg)   BMI 28.08 kg/m   Assessment and Plan: 1. Annual physical exam No subjective/objective concerns noted during history and physical exam.  Patient is previous encounters, most recent labs, most recent imaging have been reviewed with patient.Kevin Oneal is a 79 y.o. male who presents today for his Complete Annual Exam. He feels well. He reports exercising . He reports he is sleeping well. Immunizations are reviewed and recommendations provided.   Age appropriate screening tests are discussed. Counseling given for risk factor reduction interventions.  Will check renal function panel to assess GFR and glucose issues. - Renal Function Panel  2. Pure hypercholesterolemia Chronic.  Controlled.  Stable.  Patient currently controlling with diet and omega-3.  Check lipid panel with ratio to determine level of control. - Lipid panel

## 2019-08-10 LAB — RENAL FUNCTION PANEL
Albumin: 4.3 g/dL (ref 3.7–4.7)
BUN/Creatinine Ratio: 17 (ref 10–24)
BUN: 17 mg/dL (ref 8–27)
CO2: 22 mmol/L (ref 20–29)
Calcium: 9.2 mg/dL (ref 8.6–10.2)
Chloride: 104 mmol/L (ref 96–106)
Creatinine, Ser: 0.99 mg/dL (ref 0.76–1.27)
GFR calc Af Amer: 83 mL/min/{1.73_m2} (ref >59)
GFR calc non Af Amer: 72 mL/min/{1.73_m2} (ref >59)
Glucose: 92 mg/dL (ref 65–99)
Phosphorus: 3.2 mg/dL (ref 2.8–4.1)
Potassium: 4.3 mmol/L (ref 3.5–5.2)
Sodium: 140 mmol/L (ref 134–144)

## 2019-08-10 LAB — LIPID PANEL
Chol/HDL Ratio: 4 ratio (ref 0.0–5.0)
Cholesterol, Total: 180 mg/dL (ref 100–199)
HDL: 45 mg/dL (ref >39)
LDL Chol Calc (NIH): 121 mg/dL — ABNORMAL HIGH (ref 0–99)
Triglycerides: 77 mg/dL (ref 0–149)
VLDL Cholesterol Cal: 14 mg/dL (ref 5–40)

## 2019-09-13 DIAGNOSIS — H2513 Age-related nuclear cataract, bilateral: Secondary | ICD-10-CM | POA: Diagnosis not present

## 2019-09-13 DIAGNOSIS — H35371 Puckering of macula, right eye: Secondary | ICD-10-CM | POA: Diagnosis not present

## 2019-10-11 DIAGNOSIS — H2511 Age-related nuclear cataract, right eye: Secondary | ICD-10-CM | POA: Diagnosis not present

## 2019-10-12 ENCOUNTER — Encounter: Payer: Self-pay | Admitting: Ophthalmology

## 2019-10-12 ENCOUNTER — Other Ambulatory Visit: Payer: Self-pay

## 2019-10-17 ENCOUNTER — Other Ambulatory Visit: Payer: Self-pay

## 2019-10-17 ENCOUNTER — Other Ambulatory Visit
Admission: RE | Admit: 2019-10-17 | Discharge: 2019-10-17 | Disposition: A | Discharge status: Home / Self Care | Payer: Medicare PPO | Source: Ambulatory Visit | Attending: Ophthalmology | Admitting: Ophthalmology

## 2019-10-17 DIAGNOSIS — Z01812 Encounter for preprocedural laboratory examination: Secondary | ICD-10-CM | POA: Diagnosis not present

## 2019-10-17 DIAGNOSIS — Z20822 Contact with and (suspected) exposure to covid-19: Secondary | ICD-10-CM | POA: Insufficient documentation

## 2019-10-17 LAB — SARS CORONAVIRUS 2 (TAT 6-24 HRS): SARS Coronavirus 2: NEGATIVE

## 2019-10-18 NOTE — Discharge Instructions (Signed)

## 2019-10-19 ENCOUNTER — Other Ambulatory Visit: Payer: Self-pay

## 2019-10-19 ENCOUNTER — Ambulatory Visit: Payer: Medicare PPO | Admitting: Anesthesiology

## 2019-10-19 ENCOUNTER — Encounter
Admission: RE | Disposition: A | Discharge status: Home / Self Care | Payer: Self-pay | Source: Home / Self Care | Attending: Ophthalmology

## 2019-10-19 ENCOUNTER — Ambulatory Visit
Admission: RE | Admit: 2019-10-19 | Discharge: 2019-10-19 | Disposition: A | Discharge status: Home / Self Care | Payer: Medicare PPO | Attending: Ophthalmology | Admitting: Ophthalmology

## 2019-10-19 ENCOUNTER — Encounter: Payer: Self-pay | Admitting: Ophthalmology

## 2019-10-19 DIAGNOSIS — Z87891 Personal history of nicotine dependence: Secondary | ICD-10-CM | POA: Insufficient documentation

## 2019-10-19 DIAGNOSIS — M1990 Unspecified osteoarthritis, unspecified site: Secondary | ICD-10-CM | POA: Diagnosis not present

## 2019-10-19 DIAGNOSIS — Z79899 Other long term (current) drug therapy: Secondary | ICD-10-CM | POA: Insufficient documentation

## 2019-10-19 DIAGNOSIS — Z7982 Long term (current) use of aspirin: Secondary | ICD-10-CM | POA: Insufficient documentation

## 2019-10-19 DIAGNOSIS — H25811 Combined forms of age-related cataract, right eye: Secondary | ICD-10-CM | POA: Diagnosis not present

## 2019-10-19 DIAGNOSIS — Z8546 Personal history of malignant neoplasm of prostate: Secondary | ICD-10-CM | POA: Diagnosis not present

## 2019-10-19 DIAGNOSIS — Z96653 Presence of artificial knee joint, bilateral: Secondary | ICD-10-CM | POA: Diagnosis not present

## 2019-10-19 DIAGNOSIS — H2511 Age-related nuclear cataract, right eye: Secondary | ICD-10-CM | POA: Diagnosis not present

## 2019-10-19 HISTORY — DX: Personal history of other infectious and parasitic diseases: Z86.19

## 2019-10-19 HISTORY — DX: Dorsalgia, unspecified: M54.9

## 2019-10-19 HISTORY — DX: Motion sickness, initial encounter: T75.3XXA

## 2019-10-19 HISTORY — DX: Other seasonal allergic rhinitis: J30.2

## 2019-10-19 HISTORY — DX: Unspecified osteoarthritis, unspecified site: M19.90

## 2019-10-19 HISTORY — DX: Unspecified hearing loss, unspecified ear: H91.90

## 2019-10-19 HISTORY — DX: Gastro-esophageal reflux disease without esophagitis: K21.9

## 2019-10-19 HISTORY — PX: CATARACT EXTRACTION W/PHACO: SHX586

## 2019-10-19 SURGERY — PHACOEMULSIFICATION, CATARACT, WITH IOL INSERTION
Anesthesia: Monitor Anesthesia Care | Site: Eye | Laterality: Right

## 2019-10-19 MED ORDER — EPINEPHRINE PF 1 MG/ML IJ SOLN
INTRAOCULAR | Status: DC | PRN
  Administered 2019-10-19: 90 mL via OPHTHALMIC

## 2019-10-19 MED ORDER — FENTANYL CITRATE (PF) 100 MCG/2ML IJ SOLN
INTRAMUSCULAR | Status: DC | PRN
  Administered 2019-10-19: 50 ug via INTRAVENOUS
  Administered 2019-10-19 (×2): 25 ug via INTRAVENOUS

## 2019-10-19 MED ORDER — ACETAMINOPHEN 325 MG PO TABS: 325.0000 mg | ORAL_TABLET | ORAL | Status: DC | PRN

## 2019-10-19 MED ORDER — MOXIFLOXACIN HCL 0.5 % OP SOLN
1.0000 [drp] | OPHTHALMIC | Status: DC | PRN
  Administered 2019-10-19 (×3): 1 [drp] via OPHTHALMIC

## 2019-10-19 MED ORDER — BRIMONIDINE TARTRATE-TIMOLOL 0.2-0.5 % OP SOLN
OPHTHALMIC | Status: DC | PRN
  Administered 2019-10-19: 1 [drp] via OPHTHALMIC

## 2019-10-19 MED ORDER — CEFUROXIME OPHTHALMIC INJECTION 1 MG/0.1 ML
INJECTION | OPHTHALMIC | Status: DC | PRN
  Administered 2019-10-19: 0.1 mL via INTRACAMERAL

## 2019-10-19 MED ORDER — LIDOCAINE HCL (PF) 2 % IJ SOLN
INTRAOCULAR | Status: DC | PRN
  Administered 2019-10-19: 2 mL

## 2019-10-19 MED ORDER — NA HYALUR & NA CHOND-NA HYALUR 0.4-0.35 ML IO KIT
PACK | INTRAOCULAR | Status: DC | PRN
  Administered 2019-10-19: 1 mL via INTRAOCULAR

## 2019-10-19 MED ORDER — ACETAMINOPHEN 160 MG/5ML PO SOLN: 325.0000 mg | ORAL | Status: DC | PRN

## 2019-10-19 MED ORDER — ARMC OPHTHALMIC DILATING DROPS
1.0000 "application " | OPHTHALMIC | Status: DC | PRN
  Administered 2019-10-19 (×3): 1 via OPHTHALMIC

## 2019-10-19 MED ORDER — TETRACAINE HCL 0.5 % OP SOLN
1.0000 [drp] | OPHTHALMIC | Status: DC | PRN
  Administered 2019-10-19 (×3): 1 [drp] via OPHTHALMIC

## 2019-10-19 SURGICAL SUPPLY — 22 items
CANNULA ANT/CHMB 27G (MISCELLANEOUS) ×1 IMPLANT
CANNULA ANT/CHMB 27GA (MISCELLANEOUS) ×3 IMPLANT
GLOVE SURG LX 7.5 STRW (GLOVE) ×2
GLOVE SURG LX STRL 7.5 STRW (GLOVE) ×1 IMPLANT
GLOVE SURG TRIUMPH 8.0 PF LTX (GLOVE) ×3 IMPLANT
GOWN STRL REUS W/ TWL LRG LVL3 (GOWN DISPOSABLE) ×2 IMPLANT
GOWN STRL REUS W/TWL LRG LVL3 (GOWN DISPOSABLE) ×6
LENS IOL ACRYSOF IQ 21.5 (Intraocular Lens) ×2 IMPLANT
MARKER SKIN DUAL TIP RULER LAB (MISCELLANEOUS) ×3 IMPLANT
NDL CAPSULORHEX 25GA (NEEDLE) ×1 IMPLANT
NDL FILTER BLUNT 18X1 1/2 (NEEDLE) ×2 IMPLANT
NEEDLE CAPSULORHEX 25GA (NEEDLE) ×3 IMPLANT
NEEDLE FILTER BLUNT 18X 1/2SAF (NEEDLE) ×4
NEEDLE FILTER BLUNT 18X1 1/2 (NEEDLE) ×2 IMPLANT
PACK CATARACT BRASINGTON (MISCELLANEOUS) ×3 IMPLANT
PACK EYE AFTER SURG (MISCELLANEOUS) ×3 IMPLANT
PACK OPTHALMIC (MISCELLANEOUS) ×3 IMPLANT
SOLUTION OPHTHALMIC SALT (MISCELLANEOUS) ×3 IMPLANT
SYR 3ML LL SCALE MARK (SYRINGE) ×6 IMPLANT
SYR TB 1ML LUER SLIP (SYRINGE) ×3 IMPLANT
WATER STERILE IRR 250ML POUR (IV SOLUTION) ×3 IMPLANT
WIPE NON LINTING 3.25X3.25 (MISCELLANEOUS) ×3 IMPLANT

## 2019-10-19 NOTE — Anesthesia Preprocedure Evaluation (Signed)
Anesthesia Evaluation  Patient identified by MRN, date of birth, ID band Patient awake    Reviewed: Allergy & Precautions, H&P , NPO status , Patient's Chart, lab work & pertinent test results, reviewed documented beta blocker date and time   Airway Mallampati: II  TM Distance: >3 FB Neck ROM: full    Dental no notable dental hx.    Pulmonary former smoker,    Pulmonary exam normal breath sounds clear to auscultation       Cardiovascular Exercise Tolerance: Good negative cardio ROS Normal cardiovascular exam Rhythm:regular Rate:Normal     Neuro/Psych negative neurological ROS  negative psych ROS   GI/Hepatic Neg liver ROS, GERD  Controlled,  Endo/Other  negative endocrine ROS  Renal/GU negative Renal ROS  negative genitourinary   Musculoskeletal   Abdominal   Peds  Hematology negative hematology ROS (+)   Anesthesia Other Findings   Reproductive/Obstetrics negative OB ROS                             Anesthesia Physical Anesthesia Plan  ASA: II  Anesthesia Plan: MAC   Post-op Pain Management:    Induction:   PONV Risk Score and Plan:   Airway Management Planned:   Additional Equipment:   Intra-op Plan:   Post-operative Plan:   Informed Consent: I have reviewed the patients History and Physical, chart, labs and discussed the procedure including the risks, benefits and alternatives for the proposed anesthesia with the patient or authorized representative who has indicated his/her understanding and acceptance.     Dental Advisory Given  Plan Discussed with: CRNA and Anesthesiologist  Anesthesia Plan Comments:         Anesthesia Quick Evaluation

## 2019-10-19 NOTE — Transfer of Care (Signed)
Immediate Anesthesia Transfer of Care Note  Patient: Kevin Oneal  Procedure(s) Performed: CATARACT EXTRACTION PHACO AND INTRAOCULAR LENS PLACEMENT (IOC) RIGHT 4.73 00:59.9 7.9% (Right Eye)  Patient Location: PACU  Anesthesia Type: MAC  Level of Consciousness: awake, alert  and patient cooperative  Airway and Oxygen Therapy: Patient Spontanous Breathing and Patient connected to supplemental oxygen  Post-op Assessment: Post-op Vital signs reviewed, Patient's Cardiovascular Status Stable, Respiratory Function Stable, Patent Airway and No signs of Nausea or vomiting  Post-op Vital Signs: Reviewed and stable  Complications: No apparent anesthesia complications

## 2019-10-19 NOTE — Anesthesia Procedure Notes (Signed)
Procedure Name: MAC Date/Time: 10/19/2019 7:45 AM Performed by: Jeannene Patella, CRNA Pre-anesthesia Checklist: Patient identified, Emergency Drugs available, Suction available, Patient being monitored and Timeout performed Patient Re-evaluated:Patient Re-evaluated prior to induction Oxygen Delivery Method: Nasal cannula

## 2019-10-19 NOTE — Op Note (Signed)
LOCATION:  [facility]   PREOPERATIVE DIAGNOSIS:    Nuclear sclerotic cataract right eye. H25.11   POSTOPERATIVE DIAGNOSIS:  Nuclear sclerotic cataract right eye.     PROCEDURE:  Phacoemusification with posterior chamber intraocular lens placement of the right eye   ULTRASOUND TIME: Procedure(s): CATARACT EXTRACTION PHACO AND INTRAOCULAR LENS PLACEMENT (IOC) RIGHT 4.73 00:59.9 7.9% (Right)  LENS:   Implant Name Type Inv. Item Serial No. Manufacturer Lot No. LRB No. Used Action  LENS IOL ACRYSOF IQ 21.5 - [id number] Intraocular Lens LENS IOL ACRYSOF IQ 21.5 [id number] ALCON  Right 1 Implanted         SURGEON:  Wyonia Hough, MD   ANESTHESIA:  Topical with tetracaine drops and 2% Xylocaine jelly, augmented with 1% preservative-free intracameral lidocaine.    COMPLICATIONS:  None.   DESCRIPTION OF PROCEDURE:  The patient was identified in the holding room and transported to the operating room and placed in the supine position under the operating microscope.  The right eye was identified as the operative eye and it was prepped and draped in the usual sterile ophthalmic fashion.   A 1 millimeter clear-corneal paracentesis was made at the 12:00 position.  0.5 ml of preservative-free 1% lidocaine was injected into the anterior chamber. The anterior chamber was filled with Viscoat viscoelastic.  A 2.4 millimeter keratome was used to make a near-clear corneal incision at the 9:00 position.  A curvilinear capsulorrhexis was made with a cystotome and capsulorrhexis forceps.  Balanced salt solution was used to hydrodissect and hydrodelineate the nucleus.   Phacoemulsification was then used in stop and chop fashion to remove the lens nucleus and epinucleus.  The remaining cortex was then removed using the irrigation and aspiration handpiece. Provisc was then placed into the capsular bag to distend it for lens placement.  A lens was then injected into the capsular bag.  The  remaining viscoelastic was aspirated.   Wounds were hydrated with balanced salt solution.  The anterior chamber was inflated to a physiologic pressure with balanced salt solution.  No wound leaks were noted. Cefuroxime 0.1 ml of a 10mg /ml solution was injected into the anterior chamber for a dose of 1 mg of intracameral antibiotic at the completion of the case.   Timolol and Brimonidine drops were applied to the eye.  The patient was taken to the recovery room in stable condition without complications of anesthesia or surgery.   Hoda Hon 10/19/2019, 8:14 AM

## 2019-10-19 NOTE — Anesthesia Postprocedure Evaluation (Signed)
Anesthesia Post Note  Patient: Kevin Oneal  Procedure(s) Performed: CATARACT EXTRACTION PHACO AND INTRAOCULAR LENS PLACEMENT (IOC) RIGHT 4.73 00:59.9 7.9% (Right Eye)     Patient location during evaluation: PACU Anesthesia Type: MAC Level of consciousness: awake and alert Pain management: pain level controlled Vital Signs Assessment: post-procedure vital signs reviewed and stable Respiratory status: spontaneous breathing, nonlabored ventilation, respiratory function stable and patient connected to nasal cannula oxygen Cardiovascular status: stable and blood pressure returned to baseline Postop Assessment: no apparent nausea or vomiting Anesthetic complications: no    Trecia Rogers

## 2019-10-19 NOTE — H&P (Signed)

## 2019-10-20 ENCOUNTER — Encounter: Payer: Self-pay | Admitting: *Deleted

## 2019-12-13 DIAGNOSIS — K219 Gastro-esophageal reflux disease without esophagitis: Secondary | ICD-10-CM | POA: Diagnosis not present

## 2019-12-13 DIAGNOSIS — H2512 Age-related nuclear cataract, left eye: Secondary | ICD-10-CM | POA: Diagnosis not present

## 2019-12-20 ENCOUNTER — Encounter: Payer: Self-pay | Admitting: Ophthalmology

## 2019-12-20 ENCOUNTER — Other Ambulatory Visit: Payer: Self-pay

## 2019-12-26 NOTE — Discharge Instructions (Signed)

## 2019-12-28 ENCOUNTER — Encounter
Admission: RE | Disposition: A | Discharge status: Home / Self Care | Payer: Self-pay | Source: Home / Self Care | Attending: Ophthalmology

## 2019-12-28 ENCOUNTER — Encounter: Payer: Self-pay | Admitting: Ophthalmology

## 2019-12-28 ENCOUNTER — Ambulatory Visit
Admission: RE | Admit: 2019-12-28 | Discharge: 2019-12-28 | Disposition: A | Discharge status: Home / Self Care | Payer: Medicare PPO | Attending: Ophthalmology | Admitting: Ophthalmology

## 2019-12-28 ENCOUNTER — Ambulatory Visit: Payer: Medicare PPO | Admitting: Anesthesiology

## 2019-12-28 ENCOUNTER — Other Ambulatory Visit: Payer: Self-pay

## 2019-12-28 DIAGNOSIS — Z79899 Other long term (current) drug therapy: Secondary | ICD-10-CM | POA: Insufficient documentation

## 2019-12-28 DIAGNOSIS — H9190 Unspecified hearing loss, unspecified ear: Secondary | ICD-10-CM | POA: Diagnosis not present

## 2019-12-28 DIAGNOSIS — Z7982 Long term (current) use of aspirin: Secondary | ICD-10-CM | POA: Diagnosis not present

## 2019-12-28 DIAGNOSIS — Z96653 Presence of artificial knee joint, bilateral: Secondary | ICD-10-CM | POA: Insufficient documentation

## 2019-12-28 DIAGNOSIS — H25812 Combined forms of age-related cataract, left eye: Secondary | ICD-10-CM | POA: Diagnosis not present

## 2019-12-28 DIAGNOSIS — Z8546 Personal history of malignant neoplasm of prostate: Secondary | ICD-10-CM | POA: Insufficient documentation

## 2019-12-28 DIAGNOSIS — Z87891 Personal history of nicotine dependence: Secondary | ICD-10-CM | POA: Diagnosis not present

## 2019-12-28 DIAGNOSIS — H2512 Age-related nuclear cataract, left eye: Secondary | ICD-10-CM | POA: Diagnosis not present

## 2019-12-28 HISTORY — PX: CATARACT EXTRACTION W/PHACO: SHX586

## 2019-12-28 SURGERY — PHACOEMULSIFICATION, CATARACT, WITH IOL INSERTION
Anesthesia: Monitor Anesthesia Care | Site: Eye | Laterality: Left

## 2019-12-28 MED ORDER — MOXIFLOXACIN HCL 0.5 % OP SOLN
1.0000 [drp] | OPHTHALMIC | Status: DC | PRN
  Administered 2019-12-28 (×3): 1 [drp] via OPHTHALMIC

## 2019-12-28 MED ORDER — EPINEPHRINE PF 1 MG/ML IJ SOLN
INTRAOCULAR | Status: DC | PRN
  Administered 2019-12-28: 86 mL via OPHTHALMIC

## 2019-12-28 MED ORDER — BRIMONIDINE TARTRATE-TIMOLOL 0.2-0.5 % OP SOLN
OPHTHALMIC | Status: DC | PRN
  Administered 2019-12-28: 1 [drp] via OPHTHALMIC

## 2019-12-28 MED ORDER — LACTATED RINGERS IV SOLN: INTRAVENOUS | Status: DC

## 2019-12-28 MED ORDER — TETRACAINE HCL 0.5 % OP SOLN
1.0000 [drp] | OPHTHALMIC | Status: DC | PRN
  Administered 2019-12-28 (×3): 1 [drp] via OPHTHALMIC

## 2019-12-28 MED ORDER — MIDAZOLAM HCL 2 MG/2ML IJ SOLN
INTRAMUSCULAR | Status: DC | PRN
  Administered 2019-12-28: 1 mg via INTRAVENOUS

## 2019-12-28 MED ORDER — ARMC OPHTHALMIC DILATING DROPS
1.0000 "application " | OPHTHALMIC | Status: DC | PRN
  Administered 2019-12-28 (×3): 1 via OPHTHALMIC

## 2019-12-28 MED ORDER — ACETAMINOPHEN 325 MG PO TABS: 325.0000 mg | ORAL_TABLET | ORAL | Status: DC | PRN

## 2019-12-28 MED ORDER — CEFUROXIME OPHTHALMIC INJECTION 1 MG/0.1 ML
INJECTION | OPHTHALMIC | Status: DC | PRN
  Administered 2019-12-28: 0.1 mL via INTRACAMERAL

## 2019-12-28 MED ORDER — NA HYALUR & NA CHOND-NA HYALUR 0.4-0.35 ML IO KIT
PACK | INTRAOCULAR | Status: DC | PRN
  Administered 2019-12-28: 1 mL via INTRAOCULAR

## 2019-12-28 MED ORDER — LIDOCAINE HCL (PF) 2 % IJ SOLN
INTRAOCULAR | Status: DC | PRN
  Administered 2019-12-28: 1 mL

## 2019-12-28 MED ORDER — ACETAMINOPHEN 160 MG/5ML PO SOLN: 325.0000 mg | ORAL | Status: DC | PRN

## 2019-12-28 MED ORDER — FENTANYL CITRATE (PF) 100 MCG/2ML IJ SOLN
INTRAMUSCULAR | Status: DC | PRN
  Administered 2019-12-28: 50 ug via INTRAVENOUS

## 2019-12-28 SURGICAL SUPPLY — 30 items
CANNULA ANT/CHMB 27G (MISCELLANEOUS) ×1 IMPLANT
CANNULA ANT/CHMB 27GA (MISCELLANEOUS) ×3 IMPLANT
GLOVE SURG LX 7.5 STRW (GLOVE) ×4
GLOVE SURG LX STRL 7.5 STRW (GLOVE) ×1 IMPLANT
GLOVE SURG TRIUMPH 8.0 PF LTX (GLOVE) ×3 IMPLANT
GOWN STRL REUS W/ TWL LRG LVL3 (GOWN DISPOSABLE) ×2 IMPLANT
GOWN STRL REUS W/TWL LRG LVL3 (GOWN DISPOSABLE) ×6
LENS IOL ACRSF IQ TRC 3 21.0 IMPLANT
LENS IOL ACRYSOF IQ TORIC 21.0 ×3 IMPLANT
LENS IOL IQ TORIC 3 21.0 ×1 IMPLANT
MARKER SKIN DUAL TIP RULER LAB (MISCELLANEOUS) ×3 IMPLANT
NDL CAPSULORHEX 25GA (NEEDLE) ×1 IMPLANT
NDL FILTER BLUNT 18X1 1/2 (NEEDLE) ×2 IMPLANT
NDL RETROBULBAR .5 NSTRL (NEEDLE) IMPLANT
NEEDLE CAPSULORHEX 25GA (NEEDLE) ×3 IMPLANT
NEEDLE FILTER BLUNT 18X 1/2SAF (NEEDLE) ×4
NEEDLE FILTER BLUNT 18X1 1/2 (NEEDLE) ×2 IMPLANT
PACK CATARACT BRASINGTON (MISCELLANEOUS) ×3 IMPLANT
PACK EYE AFTER SURG (MISCELLANEOUS) ×3 IMPLANT
PACK OPTHALMIC (MISCELLANEOUS) ×3 IMPLANT
RING MALYGIN 7.0 (MISCELLANEOUS) IMPLANT
SOLUTION OPHTHALMIC SALT (MISCELLANEOUS) ×3 IMPLANT
SUT ETHILON 10-0 CS-B-6CS-B-6 (SUTURE)
SUT VICRYL  9 0 (SUTURE)
SUT VICRYL 9 0 (SUTURE) IMPLANT
SUTURE EHLN 10-0 CS-B-6CS-B-6 (SUTURE) IMPLANT
SYR 3ML LL SCALE MARK (SYRINGE) ×6 IMPLANT
SYR TB 1ML LUER SLIP (SYRINGE) ×3 IMPLANT
WATER STERILE IRR 250ML POUR (IV SOLUTION) ×3 IMPLANT
WIPE NON LINTING 3.25X3.25 (MISCELLANEOUS) ×3 IMPLANT

## 2019-12-28 NOTE — Anesthesia Procedure Notes (Signed)
Procedure Name: MAC Date/Time: 12/28/2019 1:44 PM Performed by: Silvana Newness, CRNA Pre-anesthesia Checklist: Patient identified, Emergency Drugs available, Suction available, Patient being monitored and Timeout performed Patient Re-evaluated:Patient Re-evaluated prior to induction Oxygen Delivery Method: Nasal cannula

## 2019-12-28 NOTE — Op Note (Signed)
LOCATION:  [facility]   PREOPERATIVE DIAGNOSIS:  Nuclear sclerotic cataract of the left eye.  H25.12  POSTOPERATIVE DIAGNOSIS:  Nuclear sclerotic cataract of the left eye.   PROCEDURE:  Phacoemulsification with Toric posterior chamber intraocular lens placement of the left eye.  Ultrasound time: Procedure(s) with comments: CATARACT EXTRACTION PHACO AND INTRAOCULAR LENS PLACEMENT (IOC) LEFT TORIC LENS (Left) - 5.56 1:18.7 7.0% LENS:[id number] 21.0 D Toric intraocular lens with 1.5 diopters of cylindrical power with axis orientation at 175 degrees.   SURGEON:  Wyonia Hough, MD   ANESTHESIA:  Topical with tetracaine drops and 2% Xylocaine jelly, augmented with 1% preservative-free intracameral lidocaine.  COMPLICATIONS:  None.   DESCRIPTION OF PROCEDURE:  The patient was identified in the holding room and transported to the operating suite and placed in the supine position under the operating microscope.  The left eye was identified as the operative eye, and it was prepped and draped in the usual sterile ophthalmic fashion.    A clear-corneal paracentesis incision was made at the 1:30 position.  0.5 ml of preservative-free 1% lidocaine was injected into the anterior chamber. The anterior chamber was filled with Viscoat.  A 2.4 millimeter near clear corneal incision was then made at the 10:30 position.  A cystotome and capsulorrhexis forceps were then used to make a curvilinear capsulorrhexis.  Hydrodissection and hydrodelineation were then performed using balanced salt solution.   Phacoemulsification was then used in stop and chop fashion to remove the lens, nucleus and epinucleus.  The remaining cortex was aspirated using the irrigation and aspiration handpiece.  Provisc viscoelastic was then placed into the capsular bag to distend it for lens placement.  The Verion digital marker was used to align the implant at the intended axis.   A 21.0 diopter lens was then injected into  the capsular bag.  It was rotated clockwise until the axis marks on the lens were approximately 15 degrees in the counterclockwise direction to the intended alignment.  The viscoelastic was aspirated from the eye using the irrigation aspiration handpiece.  Then, a Koch spatula through the sideport incision was used to rotate the lens in a clockwise direction until the axis markings of the intraocular lens were lined up with the Verion alignment.  Balanced salt solution was then used to hydrate the wounds. Cefuroxime 0.1 ml of a 10mg /ml solution was injected into the anterior chamber for a dose of 1 mg of intracameral antibiotic at the completion of the case.    The eye was noted to have a physiologic pressure and there was no wound leak noted.   Timolol and Brimonidine drops were applied to the eye.  The patient was taken to the recovery room in stable condition having had no complications of anesthesia or surgery.  Aleanna Menge 12/28/2019, 1:59 PM

## 2019-12-28 NOTE — H&P (Signed)

## 2019-12-28 NOTE — Anesthesia Preprocedure Evaluation (Signed)
Anesthesia Evaluation  Patient identified by MRN, date of birth, ID band Patient awake    Reviewed: Allergy & Precautions, H&P , NPO status , Patient's Chart, lab work & pertinent test results, reviewed documented beta blocker date and time   Airway Mallampati: II  TM Distance: >3 FB Neck ROM: full    Dental no notable dental hx.    Pulmonary neg pulmonary ROS, former smoker,    Pulmonary exam normal breath sounds clear to auscultation       Cardiovascular Exercise Tolerance: Good negative cardio ROS Normal cardiovascular exam Rhythm:regular Rate:Normal     Neuro/Psych negative neurological ROS  negative psych ROS   GI/Hepatic Neg liver ROS, GERD  Controlled,  Endo/Other  negative endocrine ROS  Renal/GU negative Renal ROS  negative genitourinary   Musculoskeletal   Abdominal   Peds  Hematology negative hematology ROS (+)   Anesthesia Other Findings   Reproductive/Obstetrics negative OB ROS                             Anesthesia Physical Anesthesia Plan  ASA: II  Anesthesia Plan: MAC   Post-op Pain Management:    Induction:   PONV Risk Score and Plan:   Airway Management Planned:   Additional Equipment:   Intra-op Plan:   Post-operative Plan:   Informed Consent: I have reviewed the patients History and Physical, chart, labs and discussed the procedure including the risks, benefits and alternatives for the proposed anesthesia with the patient or authorized representative who has indicated his/her understanding and acceptance.     Dental Advisory Given  Plan Discussed with: CRNA  Anesthesia Plan Comments:         Anesthesia Quick Evaluation

## 2019-12-28 NOTE — Anesthesia Postprocedure Evaluation (Signed)
Anesthesia Post Note  Patient: Kevin Oneal  Procedure(s) Performed: CATARACT EXTRACTION PHACO AND INTRAOCULAR LENS PLACEMENT (IOC) LEFT TORIC LENS (Left Eye)     Patient location during evaluation: PACU Anesthesia Type: MAC Level of consciousness: awake and alert Pain management: pain level controlled Vital Signs Assessment: post-procedure vital signs reviewed and stable Respiratory status: spontaneous breathing, nonlabored ventilation, respiratory function stable and patient connected to nasal cannula oxygen Cardiovascular status: stable and blood pressure returned to baseline Postop Assessment: no apparent nausea or vomiting Anesthetic complications: no   No complications documented.  Trecia Rogers

## 2019-12-28 NOTE — Transfer of Care (Signed)
Immediate Anesthesia Transfer of Care Note  Patient: Kevin Oneal  Procedure(s) Performed: CATARACT EXTRACTION PHACO AND INTRAOCULAR LENS PLACEMENT (IOC) LEFT TORIC LENS (Left Eye)  Patient Location: PACU  Anesthesia Type: MAC  Level of Consciousness: awake, alert  and patient cooperative  Airway and Oxygen Therapy: Patient Spontanous Breathing and Patient connected to supplemental oxygen  Post-op Assessment: Post-op Vital signs reviewed, Patient's Cardiovascular Status Stable, Respiratory Function Stable, Patent Airway and No signs of Nausea or vomiting  Post-op Vital Signs: Reviewed and stable  Complications: No complications documented.

## 2019-12-29 ENCOUNTER — Encounter: Payer: Self-pay | Admitting: Ophthalmology

## 2020-02-02 DIAGNOSIS — G9519 Other vascular myelopathies: Secondary | ICD-10-CM | POA: Insufficient documentation

## 2020-02-02 DIAGNOSIS — M48062 Spinal stenosis, lumbar region with neurogenic claudication: Secondary | ICD-10-CM | POA: Diagnosis not present

## 2020-02-03 ENCOUNTER — Other Ambulatory Visit: Payer: Self-pay | Admitting: Neurosurgery

## 2020-02-03 DIAGNOSIS — G9519 Other vascular myelopathies: Secondary | ICD-10-CM

## 2020-02-28 ENCOUNTER — Other Ambulatory Visit: Payer: Self-pay

## 2020-02-28 ENCOUNTER — Ambulatory Visit
Admission: RE | Admit: 2020-02-28 | Discharge: 2020-02-28 | Disposition: A | Discharge status: Home / Self Care | Payer: Medicare PPO | Source: Ambulatory Visit | Attending: Neurosurgery | Admitting: Neurosurgery

## 2020-02-28 DIAGNOSIS — G9519 Other vascular myelopathies: Secondary | ICD-10-CM

## 2020-02-28 DIAGNOSIS — M48061 Spinal stenosis, lumbar region without neurogenic claudication: Secondary | ICD-10-CM | POA: Diagnosis not present

## 2020-03-20 DIAGNOSIS — M48062 Spinal stenosis, lumbar region with neurogenic claudication: Secondary | ICD-10-CM | POA: Diagnosis not present

## 2020-03-20 DIAGNOSIS — R030 Elevated blood-pressure reading, without diagnosis of hypertension: Secondary | ICD-10-CM | POA: Insufficient documentation

## 2020-03-20 DIAGNOSIS — Z6828 Body mass index (BMI) 28.0-28.9, adult: Secondary | ICD-10-CM | POA: Insufficient documentation

## 2020-03-21 ENCOUNTER — Other Ambulatory Visit: Payer: Self-pay | Admitting: Neurosurgery

## 2020-03-22 ENCOUNTER — Telehealth: Payer: Self-pay

## 2020-03-22 NOTE — Telephone Encounter (Signed)
Copied from [place] [id number]. Topic: General - Inquiry >> Mar 22, 2020 11:41 AM Gillis Ends D wrote: Reason for CRM: Patient called to have his records updated. He got the flu vaccine on 02/28/2020 and he got his Covid Booster on today 03/22/2020. Please update his records. Please advise

## 2020-03-22 NOTE — Telephone Encounter (Signed)
updated

## 2020-04-06 NOTE — Pre-Procedure Instructions (Signed)
[id number] [address], [place] [address] Phone: [phone] Fax: [phone]      Your procedure is scheduled on Wednesday, November 3rd.  Report to [facility] Main Entrance "A" at 6:30 A.M., and check in at the Admitting office.  Call this number if you have problems the morning of surgery:  [phone]  Call [phone] if you have any questions prior to your surgery date Monday-Friday 8am-4pm    Remember:  Do not eat or drink after midnight the night before your surgery    Take these medicines the morning of surgery with A SIP OF WATER: NONE  Follow your surgeon's instructions on when to stop Aspirin.  If no instructions were given by your surgeon then you will need to call the office to get those instructions.    As of today, STOP taking any Aleve, Naproxen, Ibuprofen, Motrin, Advil, Goody's, BC's, all herbal medications, fish oil, and all vitamins.                     Do not wear jewelry.            Do not wear lotions, powders, colognes, or deodorant.            Men may shave face and neck.            Do not bring valuables to the hospital.            [facility] is not responsible for any belongings or valuables.  Do NOT Smoke (Tobacco/Vaping) or drink Alcohol 24 hours prior to your procedure If you use a CPAP at night, you may bring all equipment for your overnight stay.   Contacts, glasses, dentures or bridgework may not be worn into surgery.      For patients admitted to the hospital, discharge time will be determined by your treatment team.   Patients discharged the day of surgery will not be allowed to drive home, and someone needs to stay with them for 24 hours.    Special instructions:   [facility]- Preparing For Surgery  Before surgery, you can play an important role. Because skin is not sterile, your skin needs to be as free of germs as possible. You can reduce the number of germs on your skin by washing  with CHG (chlorahexidine gluconate) Soap before surgery.  CHG is an antiseptic cleaner which kills germs and bonds with the skin to continue killing germs even after washing.    Oral Hygiene is also important to reduce your risk of infection.  Remember - BRUSH YOUR TEETH THE MORNING OF SURGERY WITH YOUR REGULAR TOOTHPASTE  Please do not use if you have an allergy to CHG or antibacterial soaps. If your skin becomes reddened/irritated stop using the CHG.  Do not shave (including legs and underarms) for at least 48 hours prior to first CHG shower. It is OK to shave your face.  Please follow these instructions carefully.   1. Shower the NIGHT BEFORE SURGERY and the MORNING OF SURGERY with CHG Soap.   2. If you chose to wash your hair, wash your hair first as usual with your normal shampoo.  3. After you shampoo, rinse your hair and body thoroughly to remove the shampoo.  4. Use CHG as you would any other liquid soap. You can apply CHG directly to the skin and wash gently with a scrungie or a clean washcloth.   5.  Apply the CHG Soap to your body ONLY FROM THE NECK DOWN.  Do not use on open wounds or open sores. Avoid contact with your eyes, ears, mouth and genitals (private parts). Wash Face and genitals (private parts)  with your normal soap.   6. Wash thoroughly, paying special attention to the area where your surgery will be performed.  7. Thoroughly rinse your body with warm water from the neck down.  8. DO NOT shower/wash with your normal soap after using and rinsing off the CHG Soap.  9. Pat yourself dry with a CLEAN TOWEL.  10. Wear CLEAN PAJAMAS to bed the night before surgery  11. Place CLEAN SHEETS on your bed the night of your first shower and DO NOT SLEEP WITH PETS.   Day of Surgery: Wear Clean/Comfortable clothing the morning of surgery Do not apply any deodorants/lotions.   Remember to brush your teeth WITH YOUR REGULAR TOOTHPASTE.   Please read over the following fact  sheets that you were given.

## 2020-04-09 ENCOUNTER — Other Ambulatory Visit: Payer: Self-pay

## 2020-04-09 ENCOUNTER — Other Ambulatory Visit (HOSPITAL_COMMUNITY)
Admission: RE | Admit: 2020-04-09 | Discharge: 2020-04-09 | Disposition: A | Discharge status: Home / Self Care | Payer: Medicare PPO | Source: Ambulatory Visit | Attending: Neurosurgery | Admitting: Neurosurgery

## 2020-04-09 ENCOUNTER — Encounter (HOSPITAL_COMMUNITY)
Admission: RE | Admit: 2020-04-09 | Discharge: 2020-04-09 | Disposition: A | Discharge status: Home / Self Care | Payer: Medicare PPO | Source: Ambulatory Visit | Attending: Neurosurgery | Admitting: Neurosurgery

## 2020-04-09 ENCOUNTER — Encounter (HOSPITAL_COMMUNITY): Payer: Self-pay

## 2020-04-09 DIAGNOSIS — Z20822 Contact with and (suspected) exposure to covid-19: Secondary | ICD-10-CM | POA: Diagnosis present

## 2020-04-09 DIAGNOSIS — Z87891 Personal history of nicotine dependence: Secondary | ICD-10-CM | POA: Diagnosis not present

## 2020-04-09 DIAGNOSIS — M5416 Radiculopathy, lumbar region: Secondary | ICD-10-CM | POA: Diagnosis not present

## 2020-04-09 DIAGNOSIS — Z8546 Personal history of malignant neoplasm of prostate: Secondary | ICD-10-CM | POA: Diagnosis not present

## 2020-04-09 DIAGNOSIS — Z0389 Encounter for observation for other suspected diseases and conditions ruled out: Secondary | ICD-10-CM | POA: Diagnosis not present

## 2020-04-09 DIAGNOSIS — Z01818 Encounter for other preprocedural examination: Secondary | ICD-10-CM | POA: Insufficient documentation

## 2020-04-09 DIAGNOSIS — M4316 Spondylolisthesis, lumbar region: Secondary | ICD-10-CM | POA: Diagnosis present

## 2020-04-09 DIAGNOSIS — Z8619 Personal history of other infectious and parasitic diseases: Secondary | ICD-10-CM | POA: Diagnosis not present

## 2020-04-09 DIAGNOSIS — H9190 Unspecified hearing loss, unspecified ear: Secondary | ICD-10-CM | POA: Diagnosis present

## 2020-04-09 DIAGNOSIS — Z7982 Long term (current) use of aspirin: Secondary | ICD-10-CM | POA: Diagnosis not present

## 2020-04-09 DIAGNOSIS — E7800 Pure hypercholesterolemia, unspecified: Secondary | ICD-10-CM | POA: Diagnosis not present

## 2020-04-09 DIAGNOSIS — M19041 Primary osteoarthritis, right hand: Secondary | ICD-10-CM | POA: Diagnosis present

## 2020-04-09 DIAGNOSIS — M48062 Spinal stenosis, lumbar region with neurogenic claudication: Secondary | ICD-10-CM | POA: Diagnosis present

## 2020-04-09 DIAGNOSIS — K219 Gastro-esophageal reflux disease without esophagitis: Secondary | ICD-10-CM | POA: Diagnosis present

## 2020-04-09 DIAGNOSIS — Z79899 Other long term (current) drug therapy: Secondary | ICD-10-CM | POA: Diagnosis not present

## 2020-04-09 DIAGNOSIS — Z8589 Personal history of malignant neoplasm of other organs and systems: Secondary | ICD-10-CM | POA: Diagnosis not present

## 2020-04-09 DIAGNOSIS — M19042 Primary osteoarthritis, left hand: Secondary | ICD-10-CM | POA: Diagnosis present

## 2020-04-09 DIAGNOSIS — M549 Dorsalgia, unspecified: Secondary | ICD-10-CM | POA: Diagnosis present

## 2020-04-09 LAB — COMPREHENSIVE METABOLIC PANEL
ALT: 22 U/L (ref 0–44)
AST: 22 U/L (ref 15–41)
Albumin: 4.4 g/dL (ref 3.5–5.0)
Alkaline Phosphatase: 75 U/L (ref 38–126)
Anion gap: 9 (ref 5–15)
BUN: 15 mg/dL (ref 8–23)
CO2: 27 mmol/L (ref 22–32)
Calcium: 9.9 mg/dL (ref 8.9–10.3)
Chloride: 102 mmol/L (ref 98–111)
Creatinine, Ser: 0.83 mg/dL (ref 0.61–1.24)
GFR, Estimated: >60 mL/min (ref >60)
Glucose, Bld: 87 mg/dL (ref 70–99)
Potassium: 4.2 mmol/L (ref 3.5–5.1)
Sodium: 138 mmol/L (ref 135–145)
Total Bilirubin: 1.3 mg/dL — ABNORMAL HIGH (ref 0.3–1.2)
Total Protein: 7.7 g/dL (ref 6.5–8.1)

## 2020-04-09 LAB — SURGICAL PCR SCREEN
MRSA, PCR: NEGATIVE
Staphylococcus aureus: NEGATIVE

## 2020-04-09 LAB — CBC
HCT: 48.3 % (ref 39.0–52.0)
Hemoglobin: 16.8 g/dL (ref 13.0–17.0)
MCH: 31.5 pg (ref 26.0–34.0)
MCHC: 34.8 g/dL (ref 30.0–36.0)
MCV: 90.6 fL (ref 80.0–100.0)
Platelets: 222 10*3/uL (ref 150–400)
RBC: 5.33 MIL/uL (ref 4.22–5.81)
RDW: 12.5 % (ref 11.5–15.5)
WBC: 7.1 10*3/uL (ref 4.0–10.5)
nRBC: 0 % (ref 0.0–0.2)

## 2020-04-09 NOTE — Pre-Procedure Instructions (Signed)
[id number] [address], [place] [address] Phone: [phone] Fax: [phone]      Your procedure is scheduled on Wednesday, November 3rd from 09:30 AM- 12:04 PM.  Report to [facility] Main Entrance "A" at 7:30 A.M., and check in at the Admitting office.  Call this number if you have problems the morning of surgery:  [phone]  Call [phone] if you have any questions prior to your surgery date Monday-Friday 8am-4pm    Remember:  Do not eat or drink after midnight the night before your surgery.    Take these medicines the morning of surgery with A SIP OF WATER: NONE  Follow your surgeon's instructions on when to stop Aspirin.  If no instructions were given by your surgeon then you will need to call the office to get those instructions.    As of today, STOP taking any Aleve, Naproxen, Ibuprofen, Motrin, Advil, Goody's, BC's, all herbal medications, fish oil, and all vitamins.          The Morning of Surgery:            Do not wear jewelry.            Do not wear lotions, powders, colognes, or deodorant.            Men may shave face and neck.            Do not bring valuables to the hospital.            [facility] is not responsible for any belongings or valuables.  Do NOT Smoke (Tobacco/Vaping) or drink Alcohol 24 hours prior to your procedure. If you use a CPAP at night, you may bring all equipment for your overnight stay.   Contacts, glasses, dentures or bridgework may not be worn into surgery.      For patients admitted to the hospital, discharge time will be determined by your treatment team.   Patients discharged the day of surgery will not be allowed to drive home, and someone needs to stay with them for 24 hours.    Special instructions:   [facility]- Preparing For Surgery  Before surgery, you can play an important role. Because skin is not sterile, your skin needs to be as free of germs as possible. You can  reduce the number of germs on your skin by washing with CHG (chlorahexidine gluconate) Soap before surgery.  CHG is an antiseptic cleaner which kills germs and bonds with the skin to continue killing germs even after washing.    Oral Hygiene is also important to reduce your risk of infection.  Remember - BRUSH YOUR TEETH THE MORNING OF SURGERY WITH YOUR REGULAR TOOTHPASTE  Please do not use if you have an allergy to CHG or antibacterial soaps. If your skin becomes reddened/irritated stop using the CHG.  Do not shave (including legs and underarms) for at least 48 hours prior to first CHG shower. It is OK to shave your face.  Please follow these instructions carefully.   1. Shower the NIGHT BEFORE SURGERY and the MORNING OF SURGERY with CHG Soap.   2. If you chose to wash your hair, wash your hair first as usual with your normal shampoo.  3. After you shampoo, rinse your hair and body thoroughly to remove the shampoo.  4. Use CHG as you would any other liquid soap. You can apply CHG directly to the skin and wash gently with  a scrungie or a clean washcloth.   5. Apply the CHG Soap to your body ONLY FROM THE NECK DOWN.  Do not use on open wounds or open sores. Avoid contact with your eyes, ears, mouth and genitals (private parts). Wash Face and genitals (private parts)  with your normal soap.   6. Wash thoroughly, paying special attention to the area where your surgery will be performed.  7. Thoroughly rinse your body with warm water from the neck down.  8. DO NOT shower/wash with your normal soap after using and rinsing off the CHG Soap.  9. Pat yourself dry with a CLEAN TOWEL.  10. Wear CLEAN PAJAMAS to bed the night before surgery  11. Place CLEAN SHEETS on your bed the night of your first shower and DO NOT SLEEP WITH PETS.   Day of Surgery: SHOWER Wear Clean/Comfortable clothing the morning of surgery Do not apply any deodorants/lotions.   Remember to brush your teeth WITH YOUR  REGULAR TOOTHPASTE.   Please read over the following fact sheets that you were given.

## 2020-04-09 NOTE — Progress Notes (Signed)
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  Blood Thinner Instructions: N/A Aspirin Instructions: Per patient, last dose 04/03/20  ERAS Protcol - N/A PRE-SURGERY Ensure or G2- N/A  COVID TEST- 04/09/20   Anesthesia review: Yes, review ECHO and stress test  Patient denies shortness of breath, fever, cough and chest pain at PAT appointment   All instructions explained to the patient, with a verbal understanding of the material. Patient agrees to go over the instructions while at home for a better understanding. Patient also instructed to self quarantine after being tested for COVID-19. The opportunity to ask questions was provided.

## 2020-04-10 LAB — SARS CORONAVIRUS 2 (TAT 6-24 HRS): SARS Coronavirus 2: NEGATIVE

## 2020-04-10 NOTE — Progress Notes (Signed)
Anesthesia Chart Review:  Patient was evaluated by cardiologist Dr. Clayborn Bigness in 2016 for report of chest discomfort.  He had an echo and stress test, both of which were normal.  Symptoms of chest discomfort were felt to be due to GERD.  At last follow-up 08/31/2014 his symptoms had resolved.  No further cardiology work-up was recommended.  Preop labs reviewed, unremarkable.  EKG 10/29/2018 (Care Everywhere): Sinus rhythm with first-degree AV block.  Rate 60.  ST elevation, probably due to early repolarization.  Compared with ECG of 06/12/2006, first-degree AV block is now present.  Nuclear stress 08/16/2014 (Care Everywhere): Normal myocardial perfusion scan no evidence of  stress-induced myocardial ischemia ejection fraction of 59 %.Normal scan  TTE 08/16/2014 (Care Everywhere): INTERPRETATION  NORMAL LEFT VENTRICULAR SYSTOLIC FUNCTION  NORMAL RIGHT VENTRICULAR SYSTOLIC FUNCTION  NO VALVULAR STENOSIS  Normal overall left ventricular function ejection fraction greater than 55%  Relatively normal echo     Wynonia Musty [facility] Short Stay Center/Anesthesiology Phone [phone] 04/10/2020 12:00 PM

## 2020-04-10 NOTE — Anesthesia Preprocedure Evaluation (Addendum)
Anesthesia Evaluation  Patient identified by MRN, date of birth, ID band Patient awake    Reviewed: Allergy & Precautions, NPO status , Patient's Chart, lab work & pertinent test results  Airway Mallampati: II  TM Distance: >3 FB Neck ROM: Full    Dental no notable dental hx.    Pulmonary former smoker,    Pulmonary exam normal breath sounds clear to auscultation       Cardiovascular negative cardio ROS Normal cardiovascular exam Rhythm:Regular Rate:Normal     Neuro/Psych negative neurological ROS  negative psych ROS   GI/Hepatic negative GI ROS, Neg liver ROS,   Endo/Other  negative endocrine ROS  Renal/GU negative Renal ROS     Musculoskeletal  (+) Arthritis , Back pain   Abdominal   Peds  Hematology negative hematology ROS (+)   Anesthesia Other Findings Stenosis  Reproductive/Obstetrics                           Anesthesia Physical Anesthesia Plan  ASA: II  Anesthesia Plan: General   Post-op Pain Management:    Induction: Intravenous  PONV Risk Score and Plan: 2 and Ondansetron, Dexamethasone and Treatment may vary due to age or medical condition  Airway Management Planned: Oral ETT  Additional Equipment:   Intra-op Plan:   Post-operative Plan: Extubation in OR  Informed Consent: I have reviewed the patients History and Physical, chart, labs and discussed the procedure including the risks, benefits and alternatives for the proposed anesthesia with the patient or authorized representative who has indicated his/her understanding and acceptance.     Dental advisory given  Plan Discussed with: CRNA  Anesthesia Plan Comments: (PAT note by Karoline Caldwell, PA-C:  Patient was evaluated by cardiologist Dr. Clayborn Bigness in 2016 for report of chest discomfort.  He had an echo and stress test, both of which were normal.  Symptoms of chest discomfort were felt to be due to GERD.  At  last follow-up 08/31/2014 his symptoms had resolved.  No further cardiology work-up was recommended.  Preop labs reviewed, unremarkable.  EKG 10/29/2018 (Care Everywhere): Sinus rhythm with first-degree AV block.  Rate 60.  ST elevation, probably due to early repolarization.  Compared with ECG of 06/12/2006, first-degree AV block is now present.  Nuclear stress 08/16/2014 (Care Everywhere): Normal myocardial perfusion scan no evidence of  stress-induced myocardial ischemia ejection fraction of 59 %.Normal scan  TTE 08/16/2014 (Care Everywhere): INTERPRETATION  NORMAL LEFT VENTRICULAR SYSTOLIC FUNCTION  NORMAL RIGHT VENTRICULAR SYSTOLIC FUNCTION  NO VALVULAR STENOSIS  Normal overall left ventricular function ejection fraction greater than 55%  Relatively normal echo    )      Anesthesia Quick Evaluation

## 2020-04-11 ENCOUNTER — Inpatient Hospital Stay (HOSPITAL_COMMUNITY): Payer: Medicare PPO

## 2020-04-11 ENCOUNTER — Inpatient Hospital Stay (HOSPITAL_COMMUNITY): Payer: Medicare PPO | Admitting: Certified Registered Nurse Anesthetist

## 2020-04-11 ENCOUNTER — Inpatient Hospital Stay (HOSPITAL_COMMUNITY)
Admission: RE | Admit: 2020-04-11 | Discharge: 2020-04-12 | DRG: 517 | Disposition: A | Discharge status: Home / Self Care | Payer: Medicare PPO | Attending: Neurosurgery | Admitting: Neurosurgery

## 2020-04-11 ENCOUNTER — Inpatient Hospital Stay (HOSPITAL_COMMUNITY): Payer: Medicare PPO | Admitting: Physician Assistant

## 2020-04-11 ENCOUNTER — Encounter (HOSPITAL_COMMUNITY)
Admission: RE | Disposition: A | Discharge status: Home / Self Care | Payer: Self-pay | Source: Home / Self Care | Attending: Neurosurgery

## 2020-04-11 ENCOUNTER — Encounter (HOSPITAL_COMMUNITY): Payer: Self-pay | Admitting: Neurosurgery

## 2020-04-11 ENCOUNTER — Other Ambulatory Visit: Payer: Self-pay

## 2020-04-11 DIAGNOSIS — K219 Gastro-esophageal reflux disease without esophagitis: Secondary | ICD-10-CM | POA: Diagnosis present

## 2020-04-11 DIAGNOSIS — M19041 Primary osteoarthritis, right hand: Secondary | ICD-10-CM | POA: Diagnosis present

## 2020-04-11 DIAGNOSIS — Z8619 Personal history of other infectious and parasitic diseases: Secondary | ICD-10-CM

## 2020-04-11 DIAGNOSIS — Z87891 Personal history of nicotine dependence: Secondary | ICD-10-CM | POA: Diagnosis not present

## 2020-04-11 DIAGNOSIS — Z7982 Long term (current) use of aspirin: Secondary | ICD-10-CM | POA: Diagnosis not present

## 2020-04-11 DIAGNOSIS — Z20822 Contact with and (suspected) exposure to covid-19: Secondary | ICD-10-CM | POA: Diagnosis present

## 2020-04-11 DIAGNOSIS — Z79899 Other long term (current) drug therapy: Secondary | ICD-10-CM | POA: Diagnosis not present

## 2020-04-11 DIAGNOSIS — M19042 Primary osteoarthritis, left hand: Secondary | ICD-10-CM | POA: Diagnosis present

## 2020-04-11 DIAGNOSIS — Z419 Encounter for procedure for purposes other than remedying health state, unspecified: Secondary | ICD-10-CM

## 2020-04-11 DIAGNOSIS — M4316 Spondylolisthesis, lumbar region: Secondary | ICD-10-CM | POA: Diagnosis present

## 2020-04-11 DIAGNOSIS — H9190 Unspecified hearing loss, unspecified ear: Secondary | ICD-10-CM | POA: Diagnosis present

## 2020-04-11 DIAGNOSIS — Z8546 Personal history of malignant neoplasm of prostate: Secondary | ICD-10-CM | POA: Diagnosis not present

## 2020-04-11 DIAGNOSIS — Z8589 Personal history of malignant neoplasm of other organs and systems: Secondary | ICD-10-CM | POA: Diagnosis not present

## 2020-04-11 DIAGNOSIS — M48061 Spinal stenosis, lumbar region without neurogenic claudication: Secondary | ICD-10-CM | POA: Diagnosis present

## 2020-04-11 DIAGNOSIS — M48062 Spinal stenosis, lumbar region with neurogenic claudication: Secondary | ICD-10-CM | POA: Diagnosis present

## 2020-04-11 DIAGNOSIS — M549 Dorsalgia, unspecified: Secondary | ICD-10-CM | POA: Diagnosis present

## 2020-04-11 HISTORY — PX: LUMBAR LAMINECTOMY/DECOMPRESSION MICRODISCECTOMY: SHX5026

## 2020-04-11 SURGERY — LUMBAR LAMINECTOMY/DECOMPRESSION MICRODISCECTOMY 3 LEVELS
Anesthesia: General | Site: Back

## 2020-04-11 MED ORDER — ONDANSETRON HCL 4 MG PO TABS: 4.0000 mg | ORAL_TABLET | Freq: Four times a day (QID) | ORAL | Status: DC | PRN

## 2020-04-11 MED ORDER — THROMBIN (RECOMBINANT) 5000 UNITS EX SOLR
CUTANEOUS | Status: AC
  Filled 2020-04-11: qty 5000

## 2020-04-11 MED ORDER — OXYCODONE HCL 5 MG PO TABS
10.0000 mg | ORAL_TABLET | ORAL | Status: DC | PRN
  Administered 2020-04-11: 10 mg via ORAL
  Administered 2020-04-12: 5 mg via ORAL
  Filled 2020-04-11 (×2): qty 2

## 2020-04-11 MED ORDER — PROPOFOL 10 MG/ML IV BOLUS
INTRAVENOUS | Status: DC | PRN
  Administered 2020-04-11: 200 mg via INTRAVENOUS

## 2020-04-11 MED ORDER — CYCLOBENZAPRINE HCL 10 MG PO TABS
ORAL_TABLET | ORAL | Status: AC
  Filled 2020-04-11: qty 1

## 2020-04-11 MED ORDER — PHENYLEPHRINE HCL-NACL 10-0.9 MG/250ML-% IV SOLN
INTRAVENOUS | Status: DC | PRN
  Administered 2020-04-11: 25 ug/min via INTRAVENOUS

## 2020-04-11 MED ORDER — CHLORHEXIDINE GLUCONATE 0.12 % MT SOLN
15.0000 mL | Freq: Once | OROMUCOSAL | Status: AC
  Administered 2020-04-11: 15 mL via OROMUCOSAL
  Filled 2020-04-11: qty 15

## 2020-04-11 MED ORDER — PROPOFOL 10 MG/ML IV BOLUS
INTRAVENOUS | Status: AC
  Filled 2020-04-11: qty 40

## 2020-04-11 MED ORDER — SODIUM CHLORIDE 0.9 % IV SOLN: 250.0000 mL | INTRAVENOUS | Status: DC

## 2020-04-11 MED ORDER — FENTANYL CITRATE (PF) 250 MCG/5ML IJ SOLN
INTRAMUSCULAR | Status: DC | PRN
  Administered 2020-04-11: 50 ug via INTRAVENOUS
  Administered 2020-04-11: 100 ug via INTRAVENOUS

## 2020-04-11 MED ORDER — THROMBIN 5000 UNITS EX SOLR
OROMUCOSAL | Status: DC | PRN
  Administered 2020-04-11: 5 mL via TOPICAL

## 2020-04-11 MED ORDER — ONDANSETRON HCL 4 MG/2ML IJ SOLN: 4.0000 mg | Freq: Once | INTRAMUSCULAR | Status: DC | PRN

## 2020-04-11 MED ORDER — CHLORHEXIDINE GLUCONATE CLOTH 2 % EX PADS: 6.0000 | MEDICATED_PAD | Freq: Once | CUTANEOUS | Status: DC

## 2020-04-11 MED ORDER — PHENOL 1.4 % MT LIQD: 1.0000 | OROMUCOSAL | Status: DC | PRN

## 2020-04-11 MED ORDER — THROMBIN 5000 UNITS EX SOLR
CUTANEOUS | Status: AC
  Filled 2020-04-11: qty 10000

## 2020-04-11 MED ORDER — BUPIVACAINE HCL (PF) 0.25 % IJ SOLN
INTRAMUSCULAR | Status: AC
  Filled 2020-04-11: qty 30

## 2020-04-11 MED ORDER — LIDOCAINE-EPINEPHRINE 1 %-1:100000 IJ SOLN
INTRAMUSCULAR | Status: DC | PRN
  Administered 2020-04-11: 10 mL

## 2020-04-11 MED ORDER — ROCURONIUM BROMIDE 10 MG/ML (PF) SYRINGE
PREFILLED_SYRINGE | INTRAVENOUS | Status: AC
  Filled 2020-04-11: qty 10

## 2020-04-11 MED ORDER — HEMOSTATIC AGENTS (NO CHARGE) OPTIME
TOPICAL | Status: DC | PRN
  Administered 2020-04-11: 1 via TOPICAL

## 2020-04-11 MED ORDER — EPHEDRINE 5 MG/ML INJ
INTRAVENOUS | Status: AC
  Filled 2020-04-11: qty 10

## 2020-04-11 MED ORDER — ASPIRIN EC 81 MG PO TBEC
81.0000 mg | DELAYED_RELEASE_TABLET | Freq: Every day | ORAL | Status: DC
  Administered 2020-04-11 – 2020-04-12 (×2): 81 mg via ORAL
  Filled 2020-04-11 (×2): qty 1

## 2020-04-11 MED ORDER — IBUPROFEN 200 MG PO TABS
400.0000 mg | ORAL_TABLET | Freq: Four times a day (QID) | ORAL | Status: DC | PRN
  Administered 2020-04-11 – 2020-04-12 (×2): 400 mg via ORAL
  Filled 2020-04-11 (×2): qty 2

## 2020-04-11 MED ORDER — FENTANYL CITRATE (PF) 100 MCG/2ML IJ SOLN
INTRAMUSCULAR | Status: AC
  Filled 2020-04-11: qty 2

## 2020-04-11 MED ORDER — ACETAMINOPHEN 650 MG RE SUPP: 650.0000 mg | RECTAL | Status: DC | PRN

## 2020-04-11 MED ORDER — THROMBIN 5000 UNITS EX SOLR
CUTANEOUS | Status: DC | PRN
  Administered 2020-04-11: 10000 [IU] via TOPICAL

## 2020-04-11 MED ORDER — SODIUM CHLORIDE 0.9% FLUSH: 3.0000 mL | INTRAVENOUS | Status: DC | PRN

## 2020-04-11 MED ORDER — ROCURONIUM BROMIDE 10 MG/ML (PF) SYRINGE
PREFILLED_SYRINGE | INTRAVENOUS | Status: DC | PRN
  Administered 2020-04-11: 30 mg via INTRAVENOUS
  Administered 2020-04-11: 60 mg via INTRAVENOUS
  Administered 2020-04-11: 10 mg via INTRAVENOUS

## 2020-04-11 MED ORDER — PANTOPRAZOLE SODIUM 40 MG IV SOLR: 40.0000 mg | Freq: Every day | INTRAVENOUS | Status: DC

## 2020-04-11 MED ORDER — ONDANSETRON HCL 4 MG/2ML IJ SOLN
INTRAMUSCULAR | Status: AC
  Filled 2020-04-11: qty 2

## 2020-04-11 MED ORDER — DEXAMETHASONE SODIUM PHOSPHATE 10 MG/ML IJ SOLN
10.0000 mg | Freq: Once | INTRAMUSCULAR | Status: AC
  Administered 2020-04-11: 10 mg via INTRAVENOUS
  Filled 2020-04-11: qty 1

## 2020-04-11 MED ORDER — ALBUMIN HUMAN 5 % IV SOLN: INTRAVENOUS | Status: DC | PRN

## 2020-04-11 MED ORDER — BUPIVACAINE HCL (PF) 0.25 % IJ SOLN
INTRAMUSCULAR | Status: DC | PRN
  Administered 2020-04-11: 20 mL

## 2020-04-11 MED ORDER — LIDOCAINE 2% (20 MG/ML) 5 ML SYRINGE
INTRAMUSCULAR | Status: AC
  Filled 2020-04-11: qty 5

## 2020-04-11 MED ORDER — PANTOPRAZOLE SODIUM 40 MG PO TBEC
40.0000 mg | DELAYED_RELEASE_TABLET | Freq: Every day | ORAL | Status: DC
  Administered 2020-04-11: 40 mg via ORAL
  Filled 2020-04-11: qty 1

## 2020-04-11 MED ORDER — FENTANYL CITRATE (PF) 100 MCG/2ML IJ SOLN
25.0000 ug | INTRAMUSCULAR | Status: DC | PRN
  Administered 2020-04-11 (×2): 50 ug via INTRAVENOUS

## 2020-04-11 MED ORDER — CEFAZOLIN SODIUM-DEXTROSE 2-4 GM/100ML-% IV SOLN
2.0000 g | Freq: Three times a day (TID) | INTRAVENOUS | Status: DC
  Administered 2020-04-11 (×2): 2 g via INTRAVENOUS
  Filled 2020-04-11 (×2): qty 100

## 2020-04-11 MED ORDER — ADULT MULTIVITAMIN W/MINERALS CH
1.0000 | ORAL_TABLET | Freq: Every day | ORAL | Status: DC
  Administered 2020-04-11 – 2020-04-12 (×2): 1 via ORAL
  Filled 2020-04-11 (×2): qty 1

## 2020-04-11 MED ORDER — CYCLOBENZAPRINE HCL 10 MG PO TABS
10.0000 mg | ORAL_TABLET | Freq: Three times a day (TID) | ORAL | Status: DC | PRN
  Administered 2020-04-11 – 2020-04-12 (×3): 10 mg via ORAL
  Filled 2020-04-11 (×2): qty 1

## 2020-04-11 MED ORDER — EPHEDRINE SULFATE-NACL 50-0.9 MG/10ML-% IV SOSY
PREFILLED_SYRINGE | INTRAVENOUS | Status: DC | PRN
  Administered 2020-04-11 (×2): 10 mg via INTRAVENOUS

## 2020-04-11 MED ORDER — SUGAMMADEX SODIUM 200 MG/2ML IV SOLN
INTRAVENOUS | Status: DC | PRN
  Administered 2020-04-11: 200 mg via INTRAVENOUS

## 2020-04-11 MED ORDER — LIDOCAINE-EPINEPHRINE 1 %-1:100000 IJ SOLN
INTRAMUSCULAR | Status: AC
  Filled 2020-04-11: qty 1

## 2020-04-11 MED ORDER — PHENYLEPHRINE 40 MCG/ML (10ML) SYRINGE FOR IV PUSH (FOR BLOOD PRESSURE SUPPORT)
PREFILLED_SYRINGE | INTRAVENOUS | Status: AC
  Filled 2020-04-11: qty 10

## 2020-04-11 MED ORDER — CEFAZOLIN SODIUM-DEXTROSE 2-4 GM/100ML-% IV SOLN
2.0000 g | INTRAVENOUS | Status: AC
  Administered 2020-04-11: 2 g via INTRAVENOUS
  Filled 2020-04-11: qty 100

## 2020-04-11 MED ORDER — ACETAMINOPHEN 325 MG PO TABS: 650.0000 mg | ORAL_TABLET | ORAL | Status: DC | PRN

## 2020-04-11 MED ORDER — ORAL CARE MOUTH RINSE: 15.0000 mL | Freq: Once | OROMUCOSAL | Status: AC

## 2020-04-11 MED ORDER — FENTANYL CITRATE (PF) 250 MCG/5ML IJ SOLN
INTRAMUSCULAR | Status: AC
  Filled 2020-04-11: qty 5

## 2020-04-11 MED ORDER — THROMBIN 20000 UNITS EX KIT
PACK | CUTANEOUS | Status: AC
  Filled 2020-04-11: qty 1

## 2020-04-11 MED ORDER — PHENYLEPHRINE 40 MCG/ML (10ML) SYRINGE FOR IV PUSH (FOR BLOOD PRESSURE SUPPORT)
PREFILLED_SYRINGE | INTRAVENOUS | Status: DC | PRN
  Administered 2020-04-11: 80 ug via INTRAVENOUS

## 2020-04-11 MED ORDER — LIDOCAINE 2% (20 MG/ML) 5 ML SYRINGE
INTRAMUSCULAR | Status: DC | PRN
  Administered 2020-04-11: 60 mg via INTRAVENOUS

## 2020-04-11 MED ORDER — ONDANSETRON HCL 4 MG/2ML IJ SOLN: 4.0000 mg | Freq: Four times a day (QID) | INTRAMUSCULAR | Status: DC | PRN

## 2020-04-11 MED ORDER — SODIUM CHLORIDE 0.9% FLUSH: 3.0000 mL | Freq: Two times a day (BID) | INTRAVENOUS | Status: DC

## 2020-04-11 MED ORDER — 0.9 % SODIUM CHLORIDE (POUR BTL) OPTIME
TOPICAL | Status: DC | PRN
  Administered 2020-04-11: 1000 mL

## 2020-04-11 MED ORDER — TIMOLOL MALEATE 0.5 % OP SOLN
1.0000 [drp] | Freq: Every day | OPHTHALMIC | Status: DC
  Administered 2020-04-11: 1 [drp] via OPHTHALMIC
  Filled 2020-04-11: qty 5

## 2020-04-11 MED ORDER — ONE-A-DAY MENS 50+ ADVANTAGE PO TABS: ORAL_TABLET | Freq: Every day | ORAL | Status: DC

## 2020-04-11 MED ORDER — ACETAMINOPHEN 500 MG PO TABS
1000.0000 mg | ORAL_TABLET | Freq: Once | ORAL | Status: AC
  Administered 2020-04-11: 1000 mg via ORAL
  Filled 2020-04-11: qty 2

## 2020-04-11 MED ORDER — LACTATED RINGERS IV SOLN: INTRAVENOUS | Status: DC

## 2020-04-11 MED ORDER — ALUM & MAG HYDROXIDE-SIMETH 200-200-20 MG/5ML PO SUSP: 30.0000 mL | Freq: Four times a day (QID) | ORAL | Status: DC | PRN

## 2020-04-11 MED ORDER — THROMBIN 5000 UNITS EX SOLR
CUTANEOUS | Status: AC
  Filled 2020-04-11: qty 5000

## 2020-04-11 MED ORDER — ONDANSETRON HCL 4 MG/2ML IJ SOLN
INTRAMUSCULAR | Status: DC | PRN
  Administered 2020-04-11: 4 mg via INTRAVENOUS

## 2020-04-11 MED ORDER — HYDROMORPHONE HCL 1 MG/ML IJ SOLN: 0.5000 mg | INTRAMUSCULAR | Status: DC | PRN

## 2020-04-11 MED ORDER — NIACIN 500 MG PO TABS
500.0000 mg | ORAL_TABLET | Freq: Every day | ORAL | Status: DC
  Administered 2020-04-11 – 2020-04-12 (×2): 500 mg via ORAL
  Filled 2020-04-11 (×2): qty 1

## 2020-04-11 MED ORDER — MENTHOL 3 MG MT LOZG: 1.0000 | LOZENGE | OROMUCOSAL | Status: DC | PRN

## 2020-04-11 MED ORDER — THROMBIN 20000 UNITS EX SOLR
CUTANEOUS | Status: AC
  Filled 2020-04-11: qty 20000

## 2020-04-11 SURGICAL SUPPLY — 60 items
BAND RUBBER #18 3X1/16 STRL (MISCELLANEOUS) ×6 IMPLANT
BENZOIN TINCTURE PRP APPL 2/3 (GAUZE/BANDAGES/DRESSINGS) ×3 IMPLANT
BLADE CLIPPER SURG (BLADE) IMPLANT
BLADE SURG 11 STRL SS (BLADE) ×3 IMPLANT
BUR CUTTER 7.0 ROUND (BURR) ×3 IMPLANT
BUR MATCHSTICK NEURO 3.0 LAGG (BURR) ×3 IMPLANT
CANISTER SUCT 3000ML PPV (MISCELLANEOUS) ×3 IMPLANT
CARTRIDGE OIL MAESTRO DRILL (MISCELLANEOUS) ×1 IMPLANT
CLOSURE WOUND 1/2 X4 (GAUZE/BANDAGES/DRESSINGS) ×1
COVER WAND RF STERILE (DRAPES) ×3 IMPLANT
DECANTER SPIKE VIAL GLASS SM (MISCELLANEOUS) ×3 IMPLANT
DERMABOND ADVANCED (GAUZE/BANDAGES/DRESSINGS) ×2
DERMABOND ADVANCED .7 DNX12 (GAUZE/BANDAGES/DRESSINGS) ×1 IMPLANT
DIFFUSER DRILL AIR PNEUMATIC (MISCELLANEOUS) ×3 IMPLANT
DRAPE HALF SHEET 40X57 (DRAPES) IMPLANT
DRAPE LAPAROTOMY 100X72X124 (DRAPES) ×3 IMPLANT
DRAPE MICROSCOPE LEICA (MISCELLANEOUS) ×3 IMPLANT
DRAPE SURG 17X23 STRL (DRAPES) ×3 IMPLANT
DRSG OPSITE 4X5.5 SM (GAUZE/BANDAGES/DRESSINGS) ×3 IMPLANT
DRSG OPSITE POSTOP 4X8 (GAUZE/BANDAGES/DRESSINGS) ×3 IMPLANT
DURAPREP 26ML APPLICATOR (WOUND CARE) ×3 IMPLANT
ELECT REM PT RETURN 9FT ADLT (ELECTROSURGICAL) ×3
ELECTRODE REM PT RTRN 9FT ADLT (ELECTROSURGICAL) ×1 IMPLANT
EVACUATOR 1/8 PVC DRAIN (DRAIN) ×3 IMPLANT
GAUZE 4X4 16PLY RFD (DISPOSABLE) ×3 IMPLANT
GAUZE SPONGE 4X4 12PLY STRL (GAUZE/BANDAGES/DRESSINGS) ×3 IMPLANT
GLOVE BIO SURGEON STRL SZ7 (GLOVE) IMPLANT
GLOVE BIO SURGEON STRL SZ8 (GLOVE) ×3 IMPLANT
GLOVE BIOGEL PI IND STRL 7.0 (GLOVE) IMPLANT
GLOVE BIOGEL PI IND STRL 7.5 (GLOVE) ×1 IMPLANT
GLOVE BIOGEL PI INDICATOR 7.0 (GLOVE)
GLOVE BIOGEL PI INDICATOR 7.5 (GLOVE) ×2
GLOVE ECLIPSE 6.5 STRL STRAW (GLOVE) ×3 IMPLANT
GLOVE ECLIPSE 7.5 STRL STRAW (GLOVE) IMPLANT
GLOVE EXAM NITRILE XL STR (GLOVE) IMPLANT
GLOVE INDICATOR 8.5 STRL (GLOVE) ×3 IMPLANT
GOWN STRL REUS W/ TWL LRG LVL3 (GOWN DISPOSABLE) ×1 IMPLANT
GOWN STRL REUS W/ TWL XL LVL3 (GOWN DISPOSABLE) ×2 IMPLANT
GOWN STRL REUS W/TWL 2XL LVL3 (GOWN DISPOSABLE) IMPLANT
GOWN STRL REUS W/TWL LRG LVL3 (GOWN DISPOSABLE) ×3
GOWN STRL REUS W/TWL XL LVL3 (GOWN DISPOSABLE) ×6
HEMOSTAT POWDER KIT SURGIFOAM (HEMOSTASIS) ×3 IMPLANT
KIT BASIN OR (CUSTOM PROCEDURE TRAY) ×3 IMPLANT
KIT TURNOVER KIT B (KITS) ×3 IMPLANT
NEEDLE HYPO 22GX1.5 SAFETY (NEEDLE) ×3 IMPLANT
NEEDLE SPNL 22GX3.5 QUINCKE BK (NEEDLE) ×3 IMPLANT
NS IRRIG 1000ML POUR BTL (IV SOLUTION) ×3 IMPLANT
OIL CARTRIDGE MAESTRO DRILL (MISCELLANEOUS) ×3
PACK LAMINECTOMY NEURO (CUSTOM PROCEDURE TRAY) ×3 IMPLANT
SPONGE SURGIFOAM ABS GEL SZ50 (HEMOSTASIS) ×3 IMPLANT
STRIP CLOSURE SKIN 1/2X4 (GAUZE/BANDAGES/DRESSINGS) ×2 IMPLANT
SUT VIC AB 0 CT1 18XCR BRD8 (SUTURE) ×1 IMPLANT
SUT VIC AB 0 CT1 27 (SUTURE) ×3
SUT VIC AB 0 CT1 27XBRD ANBCTR (SUTURE) ×1 IMPLANT
SUT VIC AB 0 CT1 8-18 (SUTURE) ×3
SUT VIC AB 2-0 CT1 18 (SUTURE) ×3 IMPLANT
SUT VIC AB 4-0 PS2 27 (SUTURE) ×3 IMPLANT
TOWEL GREEN STERILE (TOWEL DISPOSABLE) ×3 IMPLANT
TOWEL GREEN STERILE FF (TOWEL DISPOSABLE) ×3 IMPLANT
WATER STERILE IRR 1000ML POUR (IV SOLUTION) ×3 IMPLANT

## 2020-04-11 NOTE — Anesthesia Procedure Notes (Signed)
Procedure Name: Intubation Date/Time: 04/11/2020 11:31 AM Performed by: Colin Benton, CRNA Pre-anesthesia Checklist: Patient identified, Emergency Drugs available, Suction available and Patient being monitored Patient Re-evaluated:Patient Re-evaluated prior to induction Oxygen Delivery Method: Circle system utilized Preoxygenation: Pre-oxygenation with 100% oxygen Induction Type: IV induction Ventilation: Mask ventilation without difficulty Laryngoscope Size: Miller and 2 Grade View: Grade I Tube type: Oral Tube size: 7.5 mm Number of attempts: 1 Airway Equipment and Method: Stylet and Oral airway Placement Confirmation: ETT inserted through vocal cords under direct vision,  positive ETCO2 and breath sounds checked- equal and bilateral Secured at: 23 cm Tube secured with: Tape Dental Injury: Teeth and Oropharynx as per pre-operative assessment

## 2020-04-11 NOTE — Transfer of Care (Signed)
Immediate Anesthesia Transfer of Care Note  Patient: Kevin Oneal  Procedure(s) Performed: Laminectomy and Foraminotomy - Lumbar two-Lumbar three, Lumbar three-Lumbar four, Lumbar four-Lumbar five (N/A Back)  Patient Location: PACU  Anesthesia Type:General  Level of Consciousness: drowsy  Airway & Oxygen Therapy: Patient Spontanous Breathing and Patient connected to nasal cannula oxygen  Post-op Assessment: Report given to RN and Post -op Vital signs reviewed and stable  Post vital signs: Reviewed and stable  Last Vitals:  Vitals Value Taken Time  BP 132/77 04/11/20 1346  Temp    Pulse 62 04/11/20 1347  Resp 13 04/11/20 1347  SpO2 98 % 04/11/20 1347  Vitals shown include unvalidated device data.  Last Pain:  Vitals:   04/11/20 0754  PainSc: 4          Complications: No complications documented.

## 2020-04-11 NOTE — Op Note (Signed)
Preoperative diagnosis: Severe lumbar spinal stenosis L2-3 L3-4 spinal stenosis and grade 1 spondylolisthesis L4-5 bilateral L3-L4-L5 radiculopathies with neurogenic claudication  Postoperative diagnosis: Same  Procedure: #1 decompressive lumbar laminectomies L2-3 L3-4 L4-5 bilaterally with foraminotomies of the L3, L4, and L5 nerve roots with partial medial facetectomies and foraminotomies  Surgeon: Dominica Severin Tavyn Kurka  Assistant: Ashok Pall  Anesthesia: General  EBL: Minimal  HPI: 79 year old gentleman with back pain bilateral hip and leg pain numbness tingling with neurogenic claudication work-up revealed severe spinal stenosis from L2-L5.  Due to patient picture treatment imaging findings and progression of clinical syndrome I recommended decompressive laminectomies at those 3 levels.  I extensively reviewed the risks and benefits of the operation with him as well as perioperative course expectations of outcome and alternatives of surgery and he understood and agreed to proceed forward.  Operative procedure: Patient was brought into the OR was due to general anesthesia positioned prone Wilson frame his back was prepped and draped in routine sterile fashion.  Utilizing anatomical landmarks midline incision was made after infiltration of 10 cc lidocaine with epi.  Subperiosteal dissection was carried lamina of L2, L3, L4 and L5 intraoperative x-ray confirmed defecation to 3 disc base.  At this point the spinous process at L2-3 and 4 in the superior aspect spinous process at L5 was removed central lamina was then drilled down central decompression was initiated at 3 4 marching superiorly then marching inferiorly along the gutter performed partial medial facetectomies all the way down through the middle portion of L5 just below the L5 pedicle.  There was an extensive mount of osteophyte coming off the medial facet joints primarily at 3 4 and 4 5 but also to 3 this was all aggressively under [place]  foraminotomies were performed bilaterally at L3, L4, and L5 and after decompressing for decompression and foraminotomies were performed all foramina easily excepted a coronary dilator to confirm patency.  Wounds and copiously irrigated to Kassim states was maintained a medium Hemovac drain was placed and the wound was closed in layers with an active Vicryl in a running 4 subcuticular Dermabond benzoin Steri-Strips and a sterile dressing was applied patient recovery in stable condition.  At the end the case all needle counts and sponge counts were correct.

## 2020-04-11 NOTE — Anesthesia Postprocedure Evaluation (Signed)
Anesthesia Post Note  Patient: Kevin Oneal  Procedure(s) Performed: Laminectomy and Foraminotomy - Lumbar two-Lumbar three, Lumbar three-Lumbar four, Lumbar four-Lumbar five (N/A Back)     Patient location during evaluation: PACU Anesthesia Type: General Level of consciousness: awake Pain management: pain level controlled Vital Signs Assessment: post-procedure vital signs reviewed and stable Respiratory status: spontaneous breathing, nonlabored ventilation, respiratory function stable and patient connected to nasal cannula oxygen Cardiovascular status: blood pressure returned to baseline and stable Postop Assessment: no apparent nausea or vomiting Anesthetic complications: no   No complications documented.  Last Vitals:  Vitals:   04/11/20 1544 04/11/20 1924  BP: (!) 159/87 (!) 156/98  Pulse: (!) 55 62  Resp: 18 18  Temp:  36.4 C  SpO2: 100% 100%    Last Pain:  Vitals:   04/11/20 1924  TempSrc: Oral  PainSc:                  Banita Lehn P Lucresha Dismuke

## 2020-04-11 NOTE — Progress Notes (Signed)
Spoke to Dr. Roanna Banning regarding BP.  He will come to bedside.

## 2020-04-11 NOTE — H&P (Signed)
Kevin Oneal is an 79 y.o. male.   Chief Complaint: Back pain bilateral hip and leg pain numbness and tingling neurogenic claudication HPI: 79 year old gentleman with progressive worsening back pain bilateral hip and leg pain neurogenic location work-up revealed severe spinal stenosis L2-3 L3-4 L4-5.  Due to patient's progression of clinical syndrome imaging findings and failed conservative treatment I recommended decompressive laminectomy at those levels.  I extensively went over the risks and benefits of the operation with him as well as perioperative course expectations of outcome and alternatives of surgery and he understood and agreed to proceed forward.  Past Medical History:  Diagnosis Date  . Arthritis    hands esp right  . Back pain    nerves in right leg, right thigh to foot feels numb sometimes  . Cancer [facility])    prostate  . GERD (gastroesophageal reflux disease)   . History of shingles   . HOH (hard of hearing)    aids  . Motion sickness    inner ear issues  . Seasonal allergies     Past Surgical History:  Procedure Laterality Date  . CARPAL TUNNEL RELEASE Bilateral   . CATARACT EXTRACTION W/PHACO Right 10/19/2019   Procedure: CATARACT EXTRACTION PHACO AND INTRAOCULAR LENS PLACEMENT ([facility]) RIGHT 4.73 00:59.9 7.9%;  Surgeon: Leandrew Koyanagi, MD;  Location: [place];  Service: Ophthalmology;  Laterality: Right;  . CATARACT EXTRACTION W/PHACO Left 12/28/2019   Procedure: CATARACT EXTRACTION PHACO AND INTRAOCULAR LENS PLACEMENT ([place]) LEFT TORIC LENS;  Surgeon: Leandrew Koyanagi, MD;  Location: [place];  Service: Ophthalmology;  Laterality: Left;  5.56 1:18.7 7.0%  . COLONOSCOPY  06/10/2011   Dr Candace Cruise- normal  . EYE SURGERY    . JOINT REPLACEMENT    . PROSTATE SURGERY     removal of prostate due to cancer  . REPLACEMENT TOTAL KNEE BILATERAL    . UPPER GI ENDOSCOPY      Family History  Problem Relation Age of Onset  . Cancer Father   .  Diabetes Mother   . Diabetes Brother    Social History:  reports that he quit smoking about 41 years ago. His smoking use included cigarettes. He has a 5.50 pack-year smoking history. He has never used smokeless tobacco. He reports current alcohol use of about 3.0 standard drinks of alcohol per week. He reports that he does not use drugs.  Allergies: No Known Allergies  Medications Prior to Admission  Medication Sig Dispense Refill  . aspirin EC 81 MG tablet Take 81 mg by mouth daily.    Marland Kitchen ibuprofen (ADVIL) 200 MG tablet Take 400 mg by mouth every 6 (six) hours as needed for headache or moderate pain.     . Multiple Vitamins-Minerals (ONE-A-DAY MENS 50+ ADVANTAGE PO) Take 1 tablet by mouth daily.    . niacin 500 MG tablet Take 500 mg by mouth daily.     . Omega-3 Fatty Acids (FISH OIL) 500 MG CAPS Take 1,000 mg by mouth daily.    . timolol (TIMOPTIC) 0.5 % ophthalmic solution Place 1 drop into both eyes at bedtime.     . Camph-Eucalypt-Men-Turp-Pet (MENTHOL CHEST RUB EX) Apply 1 application topically daily as needed (congestion).      Results for orders placed or performed during the hospital encounter of 04/09/20 (from the past 48 hour(s))  SARS CORONAVIRUS 2 (TAT 6-24 HRS) Nasopharyngeal Nasopharyngeal Swab     Status: None   Collection Time: 04/09/20  1:23 PM   Specimen: Nasopharyngeal  Swab  Result Value Ref Range   SARS Coronavirus 2 NEGATIVE NEGATIVE    Comment: (NOTE) SARS-CoV-2 target nucleic acids are NOT DETECTED.  The SARS-CoV-2 RNA is generally detectable in upper and lower respiratory specimens during the acute phase of infection. Negative results do not preclude SARS-CoV-2 infection, do not rule out co-infections with other pathogens, and should not be used as the sole basis for treatment or other patient management decisions. Negative results must be combined with clinical observations, patient history, and epidemiological information. The expected result is  Negative.  Fact Sheet for Patients: SugarRoll.be  Fact Sheet for Healthcare Providers: https://www.woods-mathews.com/  This test is not yet approved or cleared by the [place] FDA and  has been authorized for detection and/or diagnosis of SARS-CoV-2 by FDA under an Emergency Use Authorization (EUA). This EUA will remain  in effect (meaning this test can be used) for the duration of the COVID-19 declaration under Se ction 564(b)(1) of the Act, 21 U.S.C. section 360bbb-3(b)(1), unless the authorization is terminated or revoked sooner.  Performed at [facility], [place] [address], [place], [place] [address]    No results found.  Review of Systems  Musculoskeletal: Positive for back pain.  Neurological: Positive for numbness.    Blood pressure (!) 192/84, pulse (!) 58, temperature 98.6 F (37 C), resp. rate 20, SpO2 96 %. Physical Exam HENT:     Head: Normocephalic.     Nose: Nose normal.     Mouth/Throat:     Mouth: Mucous membranes are moist.  Eyes:     Pupils: Pupils are equal, round, and reactive to light.  Cardiovascular:     Rate and Rhythm: Normal rate.  Pulmonary:     Effort: Pulmonary effort is normal.  Abdominal:     General: Abdomen is flat.  Musculoskeletal:        General: Normal range of motion.  Skin:    General: Skin is warm.  Neurological:     General: No focal deficit present.     Mental Status: He is alert.     Comments: Patient is awake and alert strength 5 out of 5 iliopsoas, quads, hamstrings, gastroc, into tibialis, and EHL.      Assessment/Plan 79 year old presents for decompressive laminectomy L2-L5  Elaina Hoops, MD 04/11/2020, 11:12 AM

## 2020-04-12 ENCOUNTER — Encounter (HOSPITAL_COMMUNITY): Payer: Self-pay | Admitting: Neurosurgery

## 2020-04-12 MED ORDER — METHOCARBAMOL 500 MG PO TABS: 500.0000 mg | ORAL_TABLET | Freq: Four times a day (QID) | ORAL | 0 refills | Status: DC

## 2020-04-12 MED ORDER — OXYCODONE-ACETAMINOPHEN 5-325 MG PO TABS
1.0000 | ORAL_TABLET | ORAL | 0 refills | Status: DC | PRN
Start: 2020-04-12 — End: 2020-08-10

## 2020-04-12 NOTE — Evaluation (Signed)
Physical Therapy Evaluation and Discharge Patient Details Name: Kevin Oneal MRN: [id number] DOB: 26-Jul-1940 Today's Date: 04/12/2020   History of Present Illness  Pt is an 79 y/o male who presents s/p L2-L5 laminectomy/decompression on 04/11/2020. PMH significant for HOH, CA, shingles, bilateral total knee replacement.    Clinical Impression  Patient evaluated by Physical Therapy with no further acute PT needs identified. All education has been completed and the patient has no further questions. Pt was able to demonstrate transfers and ambulation with gross modified independence to supervision for safety and no AD. Pt was educated on precautions, appropriate activity progression, and car transfer. See below for any follow-up Physical Therapy or equipment needs. PT is signing off. Thank you for this referral.     Follow Up Recommendations No PT follow up;Supervision for mobility/OOB    Equipment Recommendations  None recommended by PT    Recommendations for Other Services       Precautions / Restrictions Precautions Precautions: Fall;Back Precaution Booklet Issued: Yes (comment) Precaution Comments: Reviewed handout in detail and pt was cued for precautions during functional mobility.  Required Braces or Orthoses:  (No brace needed, order) Restrictions Weight Bearing Restrictions: No      Mobility  Bed Mobility Overal bed mobility: Modified Independent Bed Mobility: Rolling;Sidelying to Sit Rolling: Modified independent (Device/Increase time) Sidelying to sit: Modified independent (Device/Increase time)       General bed mobility comments: VC's for optimal log roll technique.     Transfers Overall transfer level: Modified independent Equipment used: None Transfers: Sit to/from Stand           General transfer comment: Pt demonstrated proper hand placement on seated surface for safety. No assist required.   Ambulation/Gait Ambulation/Gait assistance:  Supervision;Modified independent (Device/Increase time) Gait Distance (Feet): 400 Feet Assistive device: None Gait Pattern/deviations: Step-through pattern;Decreased stride length;Trunk flexed Gait velocity: Decreased Gait velocity interpretation: 1.31 - 2.62 ft/sec, indicative of limited community ambulator General Gait Details: Slow but generally steady without UE support or AD.   Stairs Stairs: Yes Stairs assistance: Supervision Stair Management: One rail Right;Alternating pattern;Forwards Number of Stairs: 4 General stair comments: VC's for sequencing and general safety. No assist required.   Wheelchair Mobility    Modified Rankin (Stroke Patients Only)       Balance                                             Pertinent Vitals/Pain Pain Assessment: Faces Faces Pain Scale: Hurts little more Pain Location: incision site on back Pain Descriptors / Indicators: Operative site guarding;Grimacing Pain Intervention(s): Limited activity within patient's tolerance;Monitored during session;Repositioned    Home Living Family/patient expects to be discharged to:: Private residence Living Arrangements: Spouse/significant other Available Help at Discharge: Family;Available 24 hours/day Type of Home: House Home Access: Stairs to enter Entrance Stairs-Rails: Right;Left;Can reach both Entrance Stairs-Number of Steps: 3 Home Layout: Two level;Able to live on main level with bedroom/bathroom Home Equipment: Shower seat;Walker - 2 wheels      Prior Function Level of Independence: Independent               Hand Dominance        Extremity/Trunk Assessment   Upper Extremity Assessment Upper Extremity Assessment: Defer to OT evaluation    Lower Extremity Assessment Lower Extremity Assessment: Generalized weakness (Consistent with pre-op diagnosis)    Cervical /  Trunk Assessment Cervical / Trunk Assessment: Other exceptions Cervical / Trunk  Exceptions: s/p surgery  Communication   Communication: No difficulties;HOH  Cognition Arousal/Alertness: Awake/alert Behavior During Therapy: WFL for tasks assessed/performed Overall Cognitive Status: Within Functional Limits for tasks assessed                                        General Comments      Exercises     Assessment/Plan    PT Assessment Patent does not need any further PT services  PT Problem List         PT Treatment Interventions      PT Goals (Current goals can be found in the Care Plan section)  Acute Rehab PT Goals Patient Stated Goal: Home today PT Goal Formulation: All assessment and education complete, DC therapy    Frequency     Barriers to discharge        Co-evaluation               AM-PAC PT "6 Clicks" Mobility  Outcome Measure Help needed turning from your back to your side while in a flat bed without using bedrails?: None Help needed moving from lying on your back to sitting on the side of a flat bed without using bedrails?: None Help needed moving to and from a bed to a chair (including a wheelchair)?: None Help needed standing up from a chair using your arms (e.g., wheelchair or bedside chair)?: None Help needed to walk in hospital room?: None Help needed climbing 3-5 steps with a railing? : A Little 6 Click Score: 23    End of Session Equipment Utilized During Treatment: Gait belt Activity Tolerance: Patient tolerated treatment well Patient left: in chair;with call bell/phone within reach Nurse Communication: Mobility status PT Visit Diagnosis: Unsteadiness on feet (R26.81);Pain Pain - part of body:  (back)    Time: 0867-6195 PT Time Calculation (min) (ACUTE ONLY): 25 min   Charges:   PT Evaluation $PT Eval Low Complexity: 1 Low PT Treatments $Gait Training: 8-22 mins        Rolinda Roan, PT, DPT Acute Rehabilitation Services Pager: [phone] Office: [phone]   Thelma Comp 04/12/2020, 12:19 PM

## 2020-04-12 NOTE — Discharge Summary (Signed)
Physician Discharge Summary  Patient ID: Kevin Oneal MRN: [id number] DOB/AGE: [birth year] 81 y.o.  Admit date: 04/11/2020 Discharge date: 04/12/2020  Admission Diagnoses: Severe lumbar spinal stenosis L2-3 L3-4 spinal stenosis and grade 1 spondylolisthesis L4-5 bilateral L3-L4-L5 radiculopathies with neurogenic claudication    Discharge Diagnoses: same   Discharged Condition: good  Hospital Course: The patient was admitted on 04/11/2020 and taken to the operating room where the patient underwent decompression L2-L5. The patient tolerated the procedure well and was taken to the recovery room and then to the floor in stable condition. The hospital course was routine. There were no complications. The wound remained clean dry and intact. Pt had appropriate back soreness. No complaints of leg pain or new N/T/W. The patient remained afebrile with stable vital signs, and tolerated a regular diet. The patient continued to increase activities, and pain was well controlled with oral pain medications.   Consults: None  Significant Diagnostic Studies:  Results for orders placed or performed during the hospital encounter of 04/09/20  SARS CORONAVIRUS 2 (TAT 6-24 HRS) Nasopharyngeal Nasopharyngeal Swab   Specimen: Nasopharyngeal Swab  Result Value Ref Range   SARS Coronavirus 2 NEGATIVE NEGATIVE    DG Lumbar Spine 1 View  Result Date: 04/11/2020 CLINICAL DATA:  79 year old male undergoing lumbar surgery. EXAM: LUMBAR SPINE - 1 VIEW COMPARISON:  Lumbar radiographs 03/20/2020.  Lumbar MRI 02/28/2020. FINDINGS: Normal lumbar segmentation, which is the same numbering system used on the MRI. Intraoperative portable cross-table lateral view of the lumbar spine at 1205 hours. Surgical probe located posteriorly is directed to the L3 pedicle level on this image. Stable visible osseous structures. Vacuum disc is more apparent at L5-S1. IMPRESSION: Intraoperative localization at L3. Electronically Signed   By:  Genevie Ann M.D.   On: 04/11/2020 15:04    Antibiotics:  Anti-infectives (From admission, onward)   Start     Dose/Rate Route Frequency Ordered Stop   04/11/20 1630  ceFAZolin (ANCEF) IVPB 2g/100 mL premix        2 g 200 mL/hr over 30 Minutes Intravenous Every 8 hours 04/11/20 1542 04/13/20 1629   04/11/20 0745  ceFAZolin (ANCEF) IVPB 2g/100 mL premix        2 g 200 mL/hr over 30 Minutes Intravenous On call to O.R. 04/11/20 0738 04/11/20 1202      Discharge Exam: Blood pressure (!) 162/75, pulse 62, temperature 98.2 F (36.8 C), temperature source Oral, resp. rate 12, SpO2 97 %. Neurologic: Grossly normal Ambulating and voiding well, incision cdi  Discharge Medications:   Allergies as of 04/12/2020   No Known Allergies     Medication List    TAKE these medications   aspirin EC 81 MG tablet Take 81 mg by mouth daily.   Fish Oil 500 MG Caps Take 1,000 mg by mouth daily.   ibuprofen 200 MG tablet Commonly known as: ADVIL Take 400 mg by mouth every 6 (six) hours as needed for headache or moderate pain.   MENTHOL CHEST RUB EX Apply 1 application topically daily as needed (congestion).   methocarbamol 500 MG tablet Commonly known as: Robaxin Take 1 tablet (500 mg total) by mouth 4 (four) times daily.   niacin 500 MG tablet Take 500 mg by mouth daily.   ONE-A-DAY MENS 50+ ADVANTAGE PO Take 1 tablet by mouth daily.   oxyCODONE-acetaminophen 5-325 MG tablet Commonly known as: Percocet Take 1 tablet by mouth every 4 (four) hours as needed for severe pain.   timolol 0.5 %  ophthalmic solution Commonly known as: TIMOPTIC Place 1 drop into both eyes at bedtime.       Disposition: home   Final Dx: decompression L3-L5  Discharge Instructions     Remove dressing in 72 hours   Complete by: As directed    Call MD for:  difficulty breathing, headache or visual disturbances   Complete by: As directed    Call MD for:  hives   Complete by: As directed    Call MD for:   persistant dizziness or light-headedness   Complete by: As directed    Call MD for:  persistant nausea and vomiting   Complete by: As directed    Call MD for:  redness, tenderness, or signs of infection (pain, swelling, redness, odor or green/yellow discharge around incision site)   Complete by: As directed    Call MD for:  severe uncontrolled pain   Complete by: As directed    Call MD for:  temperature >100.4   Complete by: As directed    Diet - low sodium heart healthy   Complete by: As directed    Driving Restrictions   Complete by: As directed    No driving for 2 weeks, no riding in the car for 1 week   Increase activity slowly   Complete by: As directed          Signed: Ocie Cornfield Arhaan Chesnut 04/12/2020, 8:06 AM

## 2020-04-12 NOTE — Progress Notes (Signed)
Pt doing well. Pt and family given D/C instructions with verbal understanding. Rx's were e-prescribed to the pharmacy by MD. Pt's incision is clean and dry with no sign of infection. Pt's IV and Hemovac were removed prior to D/C. Pt D/C'd home via wheelchair per MD order. Pt is stable @ D/C and has no other needs at this time. Holli Humbles, RN

## 2020-04-16 ENCOUNTER — Other Ambulatory Visit: Payer: Self-pay

## 2020-04-16 NOTE — Patient Outreach (Signed)
[facility]) Care Management  04/16/2020  Eastin Swing Velasquez April 17, 1941 [id number]    EMMI-General Discharge RED ON EMMI ALERT Day # 1 Date: 04/14/2020 Red Alert Reason: "Scheduled follow-up? No"   Outreach attempt # 1 to patient. Spoke with patient who denies any acute issues or concerns at present. He reports that he has been up moving around and walking fairly well. Pain remains controlled following back surgery and he has not had to take much meds for it. Reviewed and addressed red alert. Patient confirms he goes for surgeon follow up appt on 04/24/20. He has not bene cleared to drive yet and his family will be taking him. He denies any RN CM needs or concerns at this time. Advised patient that they would get one more automated EMMI-GENERAL post discharge calls to assess how they are doing following recent hospitalization and will receive a call from a nurse if any of their responses were abnormal. Patient voiced understanding and was appreciative of f/u call.      Plan: RN CM will close case at this time.  Enzo Montgomery, RN,BSN,CCM [facility] Telephonic Care Management Coordinator Direct Phone: [phone] Toll Free: [phone] Fax: [phone]

## 2020-07-31 DIAGNOSIS — H40053 Ocular hypertension, bilateral: Secondary | ICD-10-CM | POA: Diagnosis not present

## 2020-08-08 ENCOUNTER — Ambulatory Visit: Payer: Self-pay

## 2020-08-08 DIAGNOSIS — H40053 Ocular hypertension, bilateral: Secondary | ICD-10-CM | POA: Diagnosis not present

## 2020-08-10 ENCOUNTER — Other Ambulatory Visit: Payer: Self-pay

## 2020-08-10 ENCOUNTER — Encounter: Payer: Self-pay | Admitting: Family Medicine

## 2020-08-10 ENCOUNTER — Ambulatory Visit: Payer: Medicare PPO | Admitting: Family Medicine

## 2020-08-10 VITALS — BP 130/88 | HR 64 | Ht 66.0 in | Wt 177.0 lb

## 2020-08-10 DIAGNOSIS — Z0000 Encounter for general adult medical examination without abnormal findings: Secondary | ICD-10-CM

## 2020-08-10 DIAGNOSIS — E7800 Pure hypercholesterolemia, unspecified: Secondary | ICD-10-CM | POA: Diagnosis not present

## 2020-08-10 DIAGNOSIS — J301 Allergic rhinitis due to pollen: Secondary | ICD-10-CM

## 2020-08-10 MED ORDER — AZELASTINE HCL 0.1 % NA SOLN: 1.0000 | Freq: Two times a day (BID) | NASAL | 12 refills | Status: DC

## 2020-08-10 NOTE — Progress Notes (Signed)
Date:  08/10/2020   Name:  Kevin Oneal   DOB:  1940/07/29   MRN:  [id number]   Chief Complaint: Annual Exam  Patient is a 80 year old male who presents for a comprehensive physical exam. The patient reports the following problems: rhinorrhea. Health maintenance has been reviewed up to date.   Lab Results  Component Value Date   CREATININE 0.83 04/09/2020   BUN 15 04/09/2020   NA 138 04/09/2020   K 4.2 04/09/2020   CL 102 04/09/2020   CO2 27 04/09/2020   Lab Results  Component Value Date   CHOL 180 08/09/2019   HDL 45 08/09/2019   LDLCALC 121 (H) 08/09/2019   TRIG 77 08/09/2019   CHOLHDL 4.0 08/09/2019   No results found for: TSH No results found for: HGBA1C Lab Results  Component Value Date   WBC 7.1 04/09/2020   HGB 16.8 04/09/2020   HCT 48.3 04/09/2020   MCV 90.6 04/09/2020   PLT 222 04/09/2020   Lab Results  Component Value Date   ALT 22 04/09/2020   AST 22 04/09/2020   ALKPHOS 75 04/09/2020   BILITOT 1.3 (H) 04/09/2020     Review of Systems  Constitutional: Negative for chills and fever.  HENT: Positive for rhinorrhea. Negative for drooling, ear discharge, ear pain, postnasal drip, sinus pressure, sinus pain and sore throat.   Eyes: Negative for redness.  Respiratory: Negative for cough, shortness of breath and wheezing.   Cardiovascular: Negative for chest pain, palpitations and leg swelling.  Gastrointestinal: Negative for abdominal pain, blood in stool, constipation, diarrhea and nausea.  Endocrine: Negative for polydipsia.  Genitourinary: Negative for dysuria, frequency, hematuria and urgency.  Musculoskeletal: Negative for back pain, myalgias and neck pain.  Skin: Negative for rash.  Allergic/Immunologic: Negative for environmental allergies.  Neurological: Negative for dizziness and headaches.  Hematological: Does not bruise/bleed easily.  Psychiatric/Behavioral: Negative for suicidal ideas. The patient is not nervous/anxious.     Patient  Active Problem List   Diagnosis Date Noted  . Spinal stenosis of lumbar region 04/11/2020  . Rotator cuff syndrome of left shoulder 06/30/2018  . Primary osteoarthritis of first carpometacarpal joint of right hand 09/08/2017  . Hand pain, right 09/08/2017  . Pure hypercholesterolemia 07/30/2017  . OA (osteoarthritis) of knee 10/05/2013  . Personal history of other malignant neoplasm of skin 02/28/2013  . Barrett's esophagus 03/17/2012    No Known Allergies  Past Surgical History:  Procedure Laterality Date  . CARPAL TUNNEL RELEASE Bilateral   . CATARACT EXTRACTION W/PHACO Right 10/19/2019   Procedure: CATARACT EXTRACTION PHACO AND INTRAOCULAR LENS PLACEMENT ([facility]) RIGHT 4.73 00:59.9 7.9%;  Surgeon: Leandrew Koyanagi, MD;  Location: [facility];  Service: Ophthalmology;  Laterality: Right;  . CATARACT EXTRACTION W/PHACO Left 12/28/2019   Procedure: CATARACT EXTRACTION PHACO AND INTRAOCULAR LENS PLACEMENT ([facility]) LEFT TORIC LENS;  Surgeon: Leandrew Koyanagi, MD;  Location: [facility];  Service: Ophthalmology;  Laterality: Left;  5.56 1:18.7 7.0%  . COLONOSCOPY  06/10/2011   Dr Candace Cruise- normal  . EYE SURGERY    . JOINT REPLACEMENT    . LUMBAR LAMINECTOMY/DECOMPRESSION MICRODISCECTOMY N/A 04/11/2020   Procedure: Laminectomy and Foraminotomy - Lumbar two-Lumbar three, Lumbar three-Lumbar four, Lumbar four-Lumbar five;  Surgeon: Kary Kos, MD;  Location: [facility];  Service: Neurosurgery;  Laterality: N/A;  . PROSTATE SURGERY     removal of prostate due to cancer  . REPLACEMENT TOTAL KNEE BILATERAL    . UPPER GI ENDOSCOPY  Social History   Tobacco Use  . Smoking status: Former Smoker    Packs/day: 0.50    Years: 11.00    Pack years: 5.50    Types: Cigarettes    Quit date: 1980    Years since quitting: 42.2  . Smokeless tobacco: Never Used  . Tobacco comment: smoking cessation materials not required  Vaping Use  . Vaping Use: Never used  Substance Use Topics  .  Alcohol use: Yes    Alcohol/week: 3.0 standard drinks    Types: 3 Shots of liquor per week  . Drug use: No     Medication list has been reviewed and updated.  Current Meds  Medication Sig  . aspirin EC 81 MG tablet Take 81 mg by mouth daily.  Marland Kitchen ibuprofen (ADVIL) 200 MG tablet Take 400 mg by mouth every 6 (six) hours as needed for headache or moderate pain.   . Multiple Vitamins-Minerals (ONE-A-DAY MENS 50+ ADVANTAGE PO) Take 1 tablet by mouth daily.  . niacin 500 MG tablet Take 500 mg by mouth daily.   . Omega-3 Fatty Acids (FISH OIL) 500 MG CAPS Take 1,000 mg by mouth daily.  . timolol (TIMOPTIC) 0.5 % ophthalmic solution Place 1 drop into both eyes at bedtime.     PHQ 2/9 Scores 08/10/2020 08/09/2019 08/08/2019 08/04/2018  PHQ - 2 Score 0 0 0 0  PHQ- 9 Score 2 1 - 0    GAD 7 : Generalized Anxiety Score 08/10/2020 08/09/2019  Nervous, Anxious, on Edge 0 1  Control/stop worrying 1 1  Worry too much - different things 1 1  Trouble relaxing 0 1  Restless 0 0  Easily annoyed or irritable 1 1  Afraid - awful might happen 0 0  Total GAD 7 Score 3 5  Anxiety Difficulty - Not difficult at all    BP Readings from Last 3 Encounters:  08/10/20 130/88  04/12/20 (!) 162/75  04/09/20 (!) 135/92    Physical Exam Vitals and nursing note reviewed.  Constitutional:      Appearance: Normal appearance. He is well-developed and well-groomed.  HENT:     Head: Normocephalic.     Jaw: There is normal jaw occlusion.     Right Ear: Hearing, tympanic membrane, ear canal and external ear normal. There is no impacted cerumen.     Left Ear: Hearing, tympanic membrane, ear canal and external ear normal. There is no impacted cerumen.     Nose: Nose normal. No congestion or rhinorrhea.     Mouth/Throat:     Lips: Pink.     Mouth: Oropharynx is clear and moist. Mucous membranes are moist.     Tongue: No lesions.     Pharynx: Oropharynx is clear. Uvula midline. No oropharyngeal exudate or posterior  oropharyngeal erythema.  Eyes:     General: Lids are normal. Vision grossly intact. Gaze aligned appropriately. No scleral icterus.       Right eye: No discharge.        Left eye: No discharge.     Extraocular Movements: Extraocular movements intact and EOM normal.     Conjunctiva/sclera: Conjunctivae normal.     Pupils: Pupils are equal, round, and reactive to light.     Funduscopic exam:    Right eye: Red reflex present.  Neck:     Thyroid: No thyromegaly or thyroid tenderness.     Vascular: No JVD.     Trachea: No tracheal deviation.  Cardiovascular:     Rate and  Rhythm: Normal rate and regular rhythm.     Chest Wall: PMI is not displaced.     Pulses: Normal pulses and intact distal pulses.          Carotid pulses are 2+ on the right side and 2+ on the left side.      Radial pulses are 2+ on the right side and 2+ on the left side.       Femoral pulses are 2+ on the right side and 2+ on the left side.      Popliteal pulses are 2+ on the right side and 2+ on the left side.       Dorsalis pedis pulses are 2+ on the right side and 2+ on the left side.       Posterior tibial pulses are 2+ on the right side and 2+ on the left side.     Heart sounds: Normal heart sounds, S1 normal and S2 normal. No murmur heard.  No systolic murmur is present.  No diastolic murmur is present. No friction rub. No gallop. No S3 or S4 sounds.   Pulmonary:     Effort: Pulmonary effort is normal. No respiratory distress.     Breath sounds: Normal breath sounds and air entry. No stridor. No decreased breath sounds, wheezing, rhonchi or rales.  Chest:     Chest wall: No tenderness.  Breasts:     Right: No mass.     Left: No mass.    Abdominal:     General: Bowel sounds are normal. There is no distension.     Palpations: Abdomen is soft. There is no hepatomegaly, splenomegaly, hepatosplenomegaly or mass.     Tenderness: There is no abdominal tenderness. There is no CVA tenderness, right CVA tenderness,  left CVA tenderness, guarding or rebound.     Hernia: No hernia is present.  Genitourinary:    Penis: Normal.      Testes: Normal.        Right: Mass or tenderness not present.        Left: Mass or tenderness not present.  Musculoskeletal:        General: No tenderness or edema. Normal range of motion.     Cervical back: Normal, full passive range of motion without pain, normal range of motion and neck supple.     Thoracic back: Normal.     Lumbar back: Normal.     Right lower leg: No edema.     Left lower leg: No edema.  Lymphadenopathy:     Head:     Right side of head: No submental or submandibular adenopathy.     Left side of head: No submental or submandibular adenopathy.     Cervical: No cervical adenopathy.     Right cervical: No superficial or deep cervical adenopathy.    Left cervical: No superficial or deep cervical adenopathy.  Skin:    General: Skin is cool.     Capillary Refill: Capillary refill takes less than 2 seconds.     Coloration: Skin is not jaundiced or pale.     Findings: No bruising, erythema, lesion or rash.  Neurological:     Mental Status: He is alert and oriented to person, place, and time.     Cranial Nerves: Cranial nerves are intact. No cranial nerve deficit.     Sensory: Sensation is intact. No sensory deficit.     Motor: Motor function is intact. No weakness.     Coordination: Coordination is intact.  Coordination normal.     Gait: Gait normal.     Deep Tendon Reflexes: Strength normal and reflexes are normal and symmetric. Reflexes normal.  Psychiatric:        Mood and Affect: Mood normal.        Behavior: Behavior is cooperative.     Wt Readings from Last 3 Encounters:  08/10/20 177 lb (80.3 kg)  04/09/20 177 lb 4.8 oz (80.4 kg)  12/28/19 177 lb (80.3 kg)    BP 130/88   Pulse 64   Ht 5\' 6"  (1.676 m)   Wt 177 lb (80.3 kg)   BMI 28.57 kg/m   Assessment and Plan: 1. Annual physical exam Patient's chart was reviewed for previous  encounters, most recent labs, most recent imaging, and care everywhere.  No subjective/objective concerns noted during history and physical exam.Kevin Oneal is a 80 y.o. male who presents today for his Complete Annual Exam. He feels well. He reports exercising . He reports he is sleeping well. Immunizations are reviewed and recommendations provided.   Age appropriate screening tests are discussed. Counseling given for risk factor reduction interventions.  We will check lipid panel renal function panel at this time. - Lipid Panel With LDL/HDL Ratio - Renal Function Panel  2. Pure hypercholesterolemia Chronic.  Controlled.  Stable.  Currently controlled with diet and we will check lipid panel. - Lipid Panel With LDL/HDL Ratio  3. Seasonal allergic rhinitis due to pollen Chronic.  Controlled.  Stable.  Patient has some spontaneous rhinorrhea.  We will initiate Astelin nasal spray 1 spray in each nostril twice a day for prevention. - azelastine (ASTELIN) 0.1 % nasal spray; Place 1 spray into both nostrils 2 (two) times daily. Use in each nostril as directed  Dispense: 30 mL; Refill: 12

## 2020-08-11 LAB — LIPID PANEL WITH LDL/HDL RATIO
Cholesterol, Total: 206 mg/dL — ABNORMAL HIGH (ref 100–199)
HDL: 43 mg/dL (ref >39)
LDL Chol Calc (NIH): 143 mg/dL — ABNORMAL HIGH (ref 0–99)
LDL/HDL Ratio: 3.3 ratio (ref 0.0–3.6)
Triglycerides: 109 mg/dL (ref 0–149)
VLDL Cholesterol Cal: 20 mg/dL (ref 5–40)

## 2020-08-11 LAB — RENAL FUNCTION PANEL
Albumin: 4.7 g/dL — ABNORMAL HIGH (ref 3.6–4.6)
BUN/Creatinine Ratio: 20 (ref 10–24)
BUN: 17 mg/dL (ref 8–27)
CO2: 21 mmol/L (ref 20–29)
Calcium: 9.6 mg/dL (ref 8.6–10.2)
Chloride: 102 mmol/L (ref 96–106)
Creatinine, Ser: 0.83 mg/dL (ref 0.76–1.27)
Glucose: 85 mg/dL (ref 65–99)
Phosphorus: 2.9 mg/dL (ref 2.8–4.1)
Potassium: 4.6 mmol/L (ref 3.5–5.2)
Sodium: 140 mmol/L (ref 134–144)
eGFR: 88 mL/min/{1.73_m2} (ref >59)

## 2020-08-15 ENCOUNTER — Ambulatory Visit: Payer: Medicare PPO | Admitting: Dermatology

## 2020-08-15 ENCOUNTER — Other Ambulatory Visit: Payer: Self-pay

## 2020-08-15 DIAGNOSIS — L570 Actinic keratosis: Secondary | ICD-10-CM

## 2020-08-15 DIAGNOSIS — D489 Neoplasm of uncertain behavior, unspecified: Secondary | ICD-10-CM

## 2020-08-15 DIAGNOSIS — Z86007 Personal history of in-situ neoplasm of skin: Secondary | ICD-10-CM

## 2020-08-15 DIAGNOSIS — L820 Inflamed seborrheic keratosis: Secondary | ICD-10-CM

## 2020-08-15 DIAGNOSIS — L578 Other skin changes due to chronic exposure to nonionizing radiation: Secondary | ICD-10-CM | POA: Diagnosis not present

## 2020-08-15 DIAGNOSIS — D485 Neoplasm of uncertain behavior of skin: Secondary | ICD-10-CM | POA: Diagnosis not present

## 2020-08-15 NOTE — Patient Instructions (Addendum)
Melanoma ABCDEs  Melanoma is the most dangerous type of skin cancer, and is the leading cause of death from skin disease.  You are more likely to develop melanoma if you:  Have light-colored skin, light-colored eyes, or red or blond hair  Spend a lot of time in the sun  Tan regularly, either outdoors or in a tanning bed  Have had blistering sunburns, especially during childhood  Have a close family member who has had a melanoma  Have atypical moles or large birthmarks  Early detection of melanoma is key since treatment is typically straightforward and cure rates are extremely high if we catch it early.   The first sign of melanoma is often a change in a mole or a new dark spot.  The ABCDE system is a way of remembering the signs of melanoma.  A for asymmetry:  The two halves do not match. B for border:  The edges of the growth are irregular. C for color:  A mixture of colors are present instead of an even brown color. D for diameter:  Melanomas are usually (but not always) greater than 67mm - the size of a pencil eraser. E for evolution:  The spot keeps changing in size, shape, and color.  Please check your skin once per month between visits. You can use a small mirror in front and a large mirror behind you to keep an eye on the back side or your body.   If you see any new or changing lesions before your next follow-up, please call to schedule a visit.  Please continue daily skin protection including broad spectrum sunscreen SPF 30+ to sun-exposed areas, reapplying every 2 hours as needed when you're outdoors.   Staying in the shade or wearing long sleeves, sun glasses (UVA+UVB protection) and wide brim hats (4-inch brim around the entire circumference of the hat) are also recommended for sun protection.   Recommend Nicotinamide 500mg  twice per day to lower risk of non-melanoma skin cancer by approximately 25%.   Recommend taking Heliocare sun protection supplement daily in sunny  weather for additional sun protection. For maximum protection on the sunniest days, you can take up to 2 capsules of regular Heliocare OR take 1 capsule of Heliocare Ultra. For prolonged exposure (such as a full day in the sun), you can repeat your dose of the supplement 4 hours after your first dose. Heliocare can be purchased at [facility] or at [email or web address].   Biopsy Wound Care Instructions  1. Leave the original bandage on for 24 hours if possible.  If the bandage becomes soaked or soiled before that time, it is OK to remove it and examine the wound.  A small amount of post-operative bleeding is normal.  If excessive bleeding occurs, remove the bandage, place gauze over the site and apply continuous pressure (no peeking) over the area for 30 minutes. If this does not work, please call our clinic as soon as possible or page your doctor if it is after hours.   2. Once a day, cleanse the wound with soap and water. It is fine to shower. If a thick crust develops you may use a Q-tip dipped into dilute hydrogen peroxide (mix 1:1 with water) to dissolve it.  Hydrogen peroxide can slow the healing process, so use it only as needed.    3. After washing, apply petroleum jelly (Vaseline) or an antibiotic ointment if your doctor prescribed one for you, followed by a bandage.    4. For  best healing, the wound should be covered with a layer of ointment at all times. If you are not able to keep the area covered with a bandage to hold the ointment in place, this may mean re-applying the ointment several times a day.  Continue this wound care until the wound has healed and is no longer open.   Itching and mild discomfort is normal during the healing process. However, if you develop pain or severe itching, please call our office.   If you have any discomfort, you can take Tylenol (acetaminophen) or ibuprofen as directed on the bottle. (Please do not take these if you have an allergy to them or cannot  take them for another reason).  Some redness, tenderness and white or yellow material in the wound is normal healing.  If the area becomes very sore and red, or develops a thick yellow-green material (pus), it may be infected; please notify us.    If you have stitches, return to clinic as directed to have the stitches removed. You will continue wound care for 2-3 days after the stitches are removed.   Wound healing continues for up to one year following surgery. It is not unusual to experience pain in the scar from time to time during the interval.  If the pain becomes severe or the scar thickens, you should notify the office.    A slight amount of redness in a scar is expected for the first six months.  After six months, the redness will fade and the scar will soften and fade.  The color difference becomes less noticeable with time.  If there are any problems, return for a post-op surgery check at your earliest convenience.  To improve the appearance of the scar, you can use silicone scar gel, cream, or sheets (such as Mederma or Serica) every night for up to one year. These are available over the counter (without a prescription).  Please call our office at [phone] for any questions or concerns.

## 2020-08-15 NOTE — Progress Notes (Signed)
New Patient Visit  Subjective  Kevin Oneal is a 80 y.o. male who presents for the following: New Patient (Initial Visit) (Patient he is here today concerning had mohs 2 years ago under left under eye and in last 3 - 4 months he noticed a new spot in that area that stays red and sore all the time. Patient also reports a spot on posterior left leg he would like checked. ).  Objective  Well appearing patient in no apparent distress; mood and affect are within normal limits.  A focused examination was performed including face, neck, ears, scalp, left leg. Relevant physical exam findings are noted in the Assessment and Plan.  Objective  left zygoma: 0.5 cm scaly pink papule      Objective  Left Thigh - Posterior: Erythematous keratotic or waxy stuck-on papule or plaque.   Objective  left cheek x 2 , frontal scalp x1: Erythematous thin papules/macules with gritty scale.   Assessment & Plan  Neoplasm of uncertain behavior left zygoma  Skin / nail biopsy Type of biopsy: punch   Informed consent: discussed and consent obtained   Timeout: patient name, date of birth, surgical site, and procedure verified   Patient was prepped and draped in usual sterile fashion: Area prepped with isopropyl alcohol. Anesthesia: the lesion was anesthetized in a standard fashion   Anesthetic:  1% lidocaine w/ epinephrine 1-100,000 buffered w/ 8.4% NaHCO3 Punch size:  3 mm Suture size:  5-0 Suture type: nylon   Suture removal (days):  7 Hemostasis achieved with: suture and aluminum chloride   Outcome: patient tolerated procedure well   Post-procedure details: wound care instructions given   Additional details:  Mupirocin and a dressing applied  Specimen 1 - Surgical pathology Differential Diagnosis: r/o scc  Check Margins: No 0.5 cm scaly pink papule  R/o scc  Inflamed seborrheic keratosis Left Thigh - Posterior  Favored over SCC or verruca.   Prior to procedure, discussed risks of  blister formation, small wound, skin dyspigmentation, or rare scar following cryotherapy.   Recheck at follow-up.   Destruction of lesion - Left Thigh - Posterior  Destruction method: cryotherapy   Informed consent: discussed and consent obtained   Lesion destroyed using liquid nitrogen: Yes   Cryotherapy cycles:  2 Outcome: patient tolerated procedure well with no complications   Post-procedure details: wound care instructions given    Actinic keratosis left cheek x 2 , frontal scalp x1  Patient deferred treatment at this time. Reviewed risk of progression to [facility].  Recommend daily broad spectrum sunscreen SPF 30+ to sun-exposed areas, reapply every 2 hours as needed. Call for new or changing lesions.  Staying in the shade or wearing long sleeves, sun glasses (UVA+UVB protection) and wide brim hats (4-inch brim around the entire circumference of the hat) are also recommended for sun protection.     History of Squamous Cell Carcinoma in Situ of the Skin Left zygoma - mohs surgery 02/28/13 - No evidence of recurrence today - Recommend regular full body skin exams - Recommend daily broad spectrum sunscreen SPF 30+ to sun-exposed areas, reapply every 2 hours as needed.  - Call if any new or changing lesions are noted between office visits  Actinic Damage - chronic, secondary to cumulative UV radiation exposure/sun exposure over time - diffuse scaly erythematous macules with underlying dyspigmentation - Recommend daily broad spectrum sunscreen SPF 30+ to sun-exposed areas, reapply every 2 hours as needed.  - Recommend staying in the shade or  wearing long sleeves, sun glasses (UVA+UVB protection) and wide brim hats (4-inch brim around the entire circumference of the hat). - Call for new or changing lesions.  Return 1 week sr removal and 2 month follow up left posterior thigh.  I, Ruthell Rummage, CMA, am acting as scribe for Forest Gleason, MD.  Documentation: I have reviewed the above  documentation for accuracy and completeness, and I agree with the above.  Forest Gleason, MD

## 2020-08-16 DIAGNOSIS — N5201 Erectile dysfunction due to arterial insufficiency: Secondary | ICD-10-CM | POA: Diagnosis not present

## 2020-08-16 DIAGNOSIS — Z8546 Personal history of malignant neoplasm of prostate: Secondary | ICD-10-CM | POA: Diagnosis not present

## 2020-08-20 ENCOUNTER — Encounter: Payer: Self-pay | Admitting: Dermatology

## 2020-08-22 ENCOUNTER — Other Ambulatory Visit: Payer: Self-pay

## 2020-08-22 ENCOUNTER — Ambulatory Visit: Payer: Medicare PPO | Admitting: Dermatology

## 2020-08-22 DIAGNOSIS — L570 Actinic keratosis: Secondary | ICD-10-CM | POA: Diagnosis not present

## 2020-08-22 NOTE — Patient Instructions (Signed)
Recommend Serica moisturizing scar formula cream every night or [facility] brand or Mederma silicone scar sheet every night for the first year after a scar appears to help with scar remodeling if desired. Scars remodel on their own for a full year.     If you have any questions or concerns for your doctor, please call our main line at [phone] and press option 4 to reach your doctor's medical assistant. If no one answers, please leave a voicemail as directed and we will return your call as soon as possible. Messages left after 4 pm will be answered the following business day.   You may also send us a message via MyChart. We typically respond to MyChart messages within 1-2 business days.  For prescription refills, please ask your pharmacy to contact our office. Our fax number is [phone].  If you have an urgent issue when the clinic is closed that cannot wait until the next business day, you can page your doctor at the number below.    Please note that while we do our best to be available for urgent issues outside of office hours, we are not available 24/7.   If you have an urgent issue and are unable to reach us, you may choose to seek medical care at your doctor's office, retail clinic, urgent care center, or emergency room.  If you have a medical emergency, please immediately call 911 or go to the emergency department.  Pager Numbers  - Dr. Kowalski: [phone]  - Dr. Moye: [phone]  - Dr. Stewart: [phone]  In the event of inclement weather, please call our main line at [phone] for an update on the status of any delays or closures.  Dermatology Medication Tips: Please keep the boxes that topical medications come in in order to help keep track of the instructions about where and how to use these. Pharmacies typically print the medication instructions only on the boxes and not directly on the medication tubes.   If your medication is too expensive, please contact  our office at [phone] option 4 or send us a message through MyChart.   We are unable to tell what your co-pay for medications will be in advance as this is different depending on your insurance coverage. However, we may be able to find a substitute medication at lower cost or fill out paperwork to get insurance to cover a needed medication.   If a prior authorization is required to get your medication covered by your insurance company, please allow us 1-2 business days to complete this process.  Drug prices often vary depending on where the prescription is filled and some pharmacies may offer cheaper prices.  The website www.goodrx.com contains coupons for medications through different pharmacies. The prices here do not account for what the cost may be with help from insurance (it may be cheaper with your insurance), but the website can give you the price if you did not use any insurance.  - You can print the associated coupon and take it with your prescription to the pharmacy.  - You may also stop by our office during regular business hours and pick up a GoodRx coupon card.  - If you need your prescription sent electronically to a different pharmacy, notify our office through [facility] MyChart or by phone at [phone] option 4.  

## 2020-08-22 NOTE — Progress Notes (Signed)
   Follow-Up Visit   Subjective  Kevin Oneal is a 80 y.o. male who presents for the following: suture removal  (Bx proven AK of the L cheek - patient is here today for suture removal and to discuss pathology results).   The following portions of the chart were reviewed this encounter and updated as appropriate:   Tobacco  Allergies  Meds  Problems  Med Hx  Surg Hx  Fam Hx     Review of Systems:  No other skin or systemic complaints except as noted in HPI or Assessment and Plan.  Objective  Well appearing patient in no apparent distress; mood and affect are within normal limits.  A focused examination was performed including the face. Relevant physical exam findings are noted in the Assessment and Plan.  Objective  L cheek: Healing biopsy site  Assessment & Plan  AK (actinic keratosis) L cheek  Biopsy proven - discussed treating with LN2 once healed from punch biopsy. Consider treatment at follow up appointment if patient agrees.   Return for appointment as scheduled.  Luther Redo, CMA, am acting as scribe for Forest Gleason, MD .  Documentation: I have reviewed the above documentation for accuracy and completeness, and I agree with the above.  Forest Gleason, MD

## 2020-08-22 NOTE — Progress Notes (Signed)
Results reviewed at suture removal. At follow-up, may consider treatment vs monitoring. Pt generally does not like to treat actinic keratoses and prefers to observe. We did discuss that this one was hypertrophic/thick.

## 2020-09-02 ENCOUNTER — Encounter: Payer: Self-pay | Admitting: Dermatology

## 2020-09-03 ENCOUNTER — Ambulatory Visit (INDEPENDENT_AMBULATORY_CARE_PROVIDER_SITE_OTHER): Payer: Medicare PPO

## 2020-09-03 VITALS — Wt 176.0 lb

## 2020-09-03 DIAGNOSIS — Z0000 Encounter for general adult medical examination without abnormal findings: Secondary | ICD-10-CM | POA: Diagnosis not present

## 2020-09-03 NOTE — Progress Notes (Signed)
Subjective:   Kevin Oneal is a 80 y.o. male who presents for Medicare Annual/Subsequent preventive examination.  Virtual Visit via Telephone Note  I connected with  Brittian Renaldo Jagielski on 09/03/20 at 10:40 AM EDT by telephone and verified that I am speaking with the correct person using two identifiers.  Location: Patient: Home Provider: [facility] Persons participating in the virtual visit: patient/Nurse Health Advisor   I discussed the limitations, risks, security and privacy concerns of performing an evaluation and management service by telephone and the availability of in person appointments. The patient expressed understanding and agreed to proceed.  Interactive audio and video telecommunications were attempted between this nurse and patient, however failed, due to patient having technical difficulties OR patient did not have access to video capability.  We continued and completed visit with audio only.  Some vital signs may be absent or patient reported.   Giulianna Rocha E Usbaldo Pannone, LPN   Review of Systems     Cardiac Risk Factors include: advanced age (>36men, >11 women);male gender     Objective:    Today's Vitals   09/03/20 1031  Weight: 176 lb (79.8 kg)  PainSc: 0-No pain   Body mass index is 28.41 kg/m.  Advanced Directives 09/03/2020 04/09/2020 12/28/2019 10/19/2019 08/08/2019 08/04/2018 07/30/2017  Does Patient Have a Medical Advance Directive? No Yes No No No No No  Type of Advance Directive - Living will - - - - -  Does patient want to make changes to medical advance directive? - - - - No - Patient declined - -  Would patient like information on creating a medical advance directive? - - No - Patient declined No - Patient declined - Yes (MAU/Ambulatory/Procedural Areas - Information given) Yes (MAU/Ambulatory/Procedural Areas - Information given)    Current Medications (verified) Outpatient Encounter Medications as of 09/03/2020  Medication Sig  . aspirin EC 81 MG tablet Take 81  mg by mouth daily.  Marland Kitchen azelastine (ASTELIN) 0.1 % nasal spray Place 1 spray into both nostrils 2 (two) times daily. Use in each nostril as directed  . ibuprofen (ADVIL) 200 MG tablet Take 400 mg by mouth every 6 (six) hours as needed for headache or moderate pain.   . Multiple Vitamins-Minerals (ONE-A-DAY MENS 50+ ADVANTAGE PO) Take 1 tablet by mouth daily.  . niacin 500 MG tablet Take 500 mg by mouth daily.   . Omega-3 Fatty Acids (FISH OIL) 500 MG CAPS Take 1,000 mg by mouth daily.  . timolol (TIMOPTIC) 0.5 % ophthalmic solution Place 1 drop into both eyes at bedtime.    No facility-administered encounter medications on file as of 09/03/2020.    Allergies (verified) Patient has no known allergies.   History: Past Medical History:  Diagnosis Date  . Arthritis    hands esp right  . Back pain    nerves in right leg, right thigh to foot feels numb sometimes  . Cancer [facility])    prostate  . GERD (gastroesophageal reflux disease)   . History of shingles   . HOH (hard of hearing)    aids  . Motion sickness    inner ear issues  . Seasonal allergies    Past Surgical History:  Procedure Laterality Date  . CARPAL TUNNEL RELEASE Bilateral   . CATARACT EXTRACTION W/PHACO Right 10/19/2019   Procedure: CATARACT EXTRACTION PHACO AND INTRAOCULAR LENS PLACEMENT ([facility]) RIGHT 4.73 00:59.9 7.9%;  Surgeon: Leandrew Koyanagi, MD;  Location: [place];  Service: Ophthalmology;  Laterality: Right;  .  CATARACT EXTRACTION W/PHACO Left 12/28/2019   Procedure: CATARACT EXTRACTION PHACO AND INTRAOCULAR LENS PLACEMENT ([place]) LEFT TORIC LENS;  Surgeon: Leandrew Koyanagi, MD;  Location: [place];  Service: Ophthalmology;  Laterality: Left;  5.56 1:18.7 7.0%  . COLONOSCOPY  06/10/2011   Dr Candace Cruise- normal  . EYE SURGERY    . JOINT REPLACEMENT    . LUMBAR LAMINECTOMY/DECOMPRESSION MICRODISCECTOMY N/A 04/11/2020   Procedure: Laminectomy and Foraminotomy - Lumbar two-Lumbar three, Lumbar  three-Lumbar four, Lumbar four-Lumbar five;  Surgeon: Kary Kos, MD;  Location: [place];  Service: Neurosurgery;  Laterality: N/A;  . PROSTATE SURGERY     removal of prostate due to cancer  . REPLACEMENT TOTAL KNEE BILATERAL    . UPPER GI ENDOSCOPY     Family History  Problem Relation Age of Onset  . Cancer Father   . Diabetes Mother   . Diabetes Brother    Social History   Socioeconomic History  . Marital status: Divorced    Spouse name: Not on file  . Number of children: 3  . Years of education: some college  . Highest education level: 12th grade  Occupational History  . Occupation: Retired  Tobacco Use  . Smoking status: Former Smoker    Packs/day: 0.50    Years: 11.00    Pack years: 5.50    Types: Cigarettes    Quit date: 1980    Years since quitting: 42.2  . Smokeless tobacco: Never Used  . Tobacco comment: smoking cessation materials not required  Vaping Use  . Vaping Use: Never used  Substance and Sexual Activity  . Alcohol use: Yes    Alcohol/week: 3.0 standard drinks    Types: 3 Shots of liquor per week    Comment: usually one drink every evening  . Drug use: No  . Sexual activity: Yes  Other Topics Concern  . Not on file  Social History Narrative   Lives with significant other   Social Determinants of Health   Financial Resource Strain: Low Risk   . Difficulty of Paying Living Expenses: Not hard at all  Food Insecurity: No Food Insecurity  . Worried About Charity fundraiser in the Last Year: Never true  . Ran Out of Food in the Last Year: Never true  Transportation Needs: No Transportation Needs  . Lack of Transportation (Medical): No  . Lack of Transportation (Non-Medical): No  Physical Activity: Sufficiently Active  . Days of Exercise per Week: 7 days  . Minutes of Exercise per Session: 30 min  Stress: No Stress Concern Present  . Feeling of Stress : Only a little  Social Connections: Socially Integrated  . Frequency of Communication with  Friends and Family: More than three times a week  . Frequency of Social Gatherings with Friends and Family: More than three times a week  . Attends Religious Services: More than 4 times per year  . Active Member of Clubs or Organizations: Yes  . Attends Archivist Meetings: More than 4 times per year  . Marital Status: Living with partner    Tobacco Counseling Counseling given: Not Answered Comment: smoking cessation materials not required   Clinical Intake:  Pre-visit preparation completed: Yes  Pain : No/denies pain Pain Score: 0-No pain     BMI - recorded: 28.41 Nutritional Status: BMI 25 -29 Overweight Nutritional Risks: None Diabetes: No  How often do you need to have someone help you when you read instructions, pamphlets, or other written materials from your  doctor or pharmacy?: 1 - Never  Diabetic? No  Interpreter Needed?: No  Information entered by :: Jonan Seufert, LPN   Activities of Daily Living In your present state of health, do you have any difficulty performing the following activities: 09/03/2020 04/09/2020  Hearing? N Y  Comment - HOH; Bilat hearing aids  Vision? N N  Difficulty concentrating or making decisions? N N  Walking or climbing stairs? N Y  Dressing or bathing? N N  Doing errands, shopping? N N  Preparing Food and eating ? N -  Using the Toilet? N -  In the past six months, have you accidently leaked urine? Y -  Comment hx of prostate cancer, small amt of leakage intermittently -  Do you have problems with loss of bowel control? N -  Managing your Medications? N -  Managing your Finances? N -  Housekeeping or managing your Housekeeping? N -  Some recent data might be hidden    Patient Care Team: Juline Patch, MD as PCP - General (Family Medicine) Alfonso Patten, MD as Consulting Physician (Dermatology) Kary Kos, MD as Consulting Physician (Neurosurgery) Leandrew Koyanagi, MD as Referring Physician  (Ophthalmology) Festus Aloe, MD as Consulting Physician (Urology)  Indicate any recent Medical Services you may have received from other than [facility] providers in the past year (date may be approximate).     Assessment:   This is a routine wellness examination for [place].  Hearing/Vision screen  Hearing Screening   125Hz  250Hz  500Hz  1000Hz  2000Hz  3000Hz  4000Hz  6000Hz  8000Hz   Right ear:           Left ear:           Comments: Wears hearing aids  Vision Screening Comments: Annual Vision screenings done at [place] issues and exercise activities discussed: Current Exercise Habits: Home exercise routine, Type of exercise: calisthenics;walking;stretching, Time (Minutes): 30, Frequency (Times/Week): 7, Weekly Exercise (Minutes/Week): 210, Intensity: Mild, Exercise limited by: orthopedic condition(s)  Goals    . DIET - INCREASE WATER INTAKE     Recommend to drink at least 6-8 8oz glasses of water per day.    . Patient Stated     Would like to get more physically active and get legs in better shape since having back surgery      Depression Screen PHQ 2/9 Scores 09/03/2020 08/10/2020 08/09/2019 08/08/2019 08/04/2018 08/03/2018 07/30/2017  PHQ - 2 Score 0 0 0 0 0 0 0  PHQ- 9 Score 0 2 1 - 0 0 0    Fall Risk Fall Risk  09/03/2020 08/10/2020 08/09/2019 08/08/2019 08/04/2018  Falls in the past year? 0 0 0 0 0  Number falls in past yr: 0 - - 0 0  Injury with Fall? 0 - - 0 0  Risk for fall due to : Orthopedic patient - - Orthopedic patient -  Follow up Falls prevention discussed Falls evaluation completed Falls evaluation completed Falls prevention discussed Falls prevention discussed    FALL RISK PREVENTION PERTAINING TO THE HOME:  Any stairs in or around the home? No  If so, are there any without handrails? Yes  Home free of loose throw rugs in walkways, pet beds, electrical cords, etc? Yes  Adequate lighting in your home to reduce risk of falls? Yes   ASSISTIVE DEVICES  UTILIZED TO PREVENT FALLS:  Life alert? No  Use of a cane, walker or w/c? No  Grab bars in the bathroom? Yes  Shower chair or bench in shower?  Yes  Elevated toilet seat or a handicapped toilet? Yes   TIMED UP AND GO:  Was the test performed? No . Telephonic visit  Cognitive Function:     6CIT Screen 08/04/2018 07/30/2017  What Year? 0 points 0 points  What month? 0 points 0 points  What time? 0 points 0 points  Count back from 20 0 points 0 points  Months in reverse 0 points 0 points  Repeat phrase 2 points 0 points  Total Score 2 0    Immunizations Immunization History  Administered Date(s) Administered  . Influenza, High Dose Seasonal PF 04/06/2018  . Influenza-Unspecified 03/18/2019, 02/28/2020  . PFIZER(Purple Top)SARS-COV-2 Vaccination 06/15/2019, 07/06/2019, 03/22/2020  . Pneumococcal Conjugate-13 05/28/2016  . Pneumococcal Polysaccharide-23 07/30/2017  . Td 03/26/2015  . Zoster 03/26/2015  . Zoster Recombinat (Shingrix) 03/29/2019, 09/19/2019    TDAP status: Up to date  Flu Vaccine status: Up to date  Pneumococcal vaccine status: Up to date  Covid-19 vaccine status: Completed vaccines  Qualifies for Shingles Vaccine? Yes   Zostavax completed Yes   Shingrix Completed?: Yes  Screening Tests Health Maintenance  Topic Date Due  . COVID-19 Vaccine (4 - Booster for Pfizer series) 09/20/2020  . TETANUS/TDAP  03/25/2025  . INFLUENZA VACCINE  Completed  . PNA vac Low Risk Adult  Completed  . HPV VACCINES  Aged Out    Health Maintenance  There are no preventive care reminders to display for this patient.  Colorectal cancer screening: Done 2012 - No longer recommended due to age.  Lung Cancer Screening: (Low Dose CT Chest recommended if Age 86-80 years, 30 pack-year currently smoking OR have quit w/in 15years.) does not qualify.   Additional Screening:  Hepatitis C Screening: does not qualify  Vision Screening: Recommended annual ophthalmology exams  for early detection of glaucoma and other disorders of the eye. Is the patient up to date with their annual eye exam?  Yes  Who is the provider or what is the name of the office in which the patient attends annual eye exams? [place] If pt is not established with a provider, would they like to be referred to a provider to establish care? No .   Dental Screening: Recommended annual dental exams for proper oral hygiene  Community Resource Referral / Chronic Care Management: CRR required this visit?  No   CCM required this visit?  No      Plan:     I have personally reviewed and noted the following in the patient's chart:   . Medical and social history . Use of alcohol, tobacco or illicit drugs  . Current medications and supplements . Functional ability and status . Nutritional status . Physical activity . Advanced directives . List of other physicians . Hospitalizations, surgeries, and ER visits in previous 12 months . Vitals . Screenings to include cognitive, depression, and falls . Referrals and appointments  In addition, I have reviewed and discussed with patient certain preventive protocols, quality metrics, and best practice recommendations. A written personalized care plan for preventive services as well as general preventive health recommendations were provided to patient.     Sandrea Hammond, LPN   02/09/4096   Nurse Notes: Keep up the great work! Continue to work on leg strengthening exercises

## 2020-09-03 NOTE — Patient Instructions (Signed)
[name] , Thank you for taking time to come for your Medicare Wellness Visit. I appreciate your ongoing commitment to your health goals. Please review the following plan we discussed and let me know if I can assist you in the future.   Screening recommendations/referrals: Colonoscopy: Done 2012 - No longer recommended due to age Recommended yearly ophthalmology/optometry visit for glaucoma screening and checkup Recommended yearly dental visit for hygiene and checkup  Vaccinations: Influenza vaccine: Done 02/28/2020 Pneumococcal vaccine: Done 07/30/2017 Tdap vaccine: Done 03/26/2015 - Repeat in 2026 Shingles vaccine: Done 03/29/2019 & 09/19/2019   Covid-19: Complete: 06/15/19, 07/06/19, & 03/22/20  Advanced directives: Please bring a copy of your health care power of attorney and living will to the office to be added to your chart at your convenience.  Conditions/risks identified: Keep up the great work and continue working on leg strengthening exercises!  Next appointment: Follow up in one year for your annual wellness visit.   Preventive Care 55 Years and Older, Male  Preventive care refers to lifestyle choices and visits with your health care provider that can promote health and wellness. What does preventive care include?  A yearly physical exam. This is also called an annual well check.  Dental exams once or twice a year.  Routine eye exams. Ask your health care provider how often you should have your eyes checked.  Personal lifestyle choices, including:  Daily care of your teeth and gums.  Regular physical activity.  Eating a healthy diet.  Avoiding tobacco and drug use.  Limiting alcohol use.  Practicing safe sex.  Taking low doses of aspirin every day.  Taking vitamin and mineral supplements as recommended by your health care provider. What happens during an annual well check? The services and screenings done by your health care provider during your annual well  check will depend on your age, overall health, lifestyle risk factors, and family history of disease. Counseling  Your health care provider may ask you questions about your:  Alcohol use.  Tobacco use.  Drug use.  Emotional well-being.  Home and relationship well-being.  Sexual activity.  Eating habits.  History of falls.  Memory and ability to understand (cognition).  Work and work Statistician. Screening  You may have the following tests or measurements:  Height, weight, and BMI.  Blood pressure.  Lipid and cholesterol levels. These may be checked every 5 years, or more frequently if you are over 66 years old.  Skin check.  Lung cancer screening. You may have this screening every year starting at age 7 if you have a 30-pack-year history of smoking and currently smoke or have quit within the past 15 years.  Fecal occult blood test (FOBT) of the stool. You may have this test every year starting at age 46.  Flexible sigmoidoscopy or colonoscopy. You may have a sigmoidoscopy every 5 years or a colonoscopy every 10 years starting at age 72.  Prostate cancer screening. Recommendations will vary depending on your family history and other risks.  Hepatitis C blood test.  Hepatitis B blood test.  Sexually transmitted disease (STD) testing.  Diabetes screening. This is done by checking your blood sugar (glucose) after you have not eaten for a while (fasting). You may have this done every 1-3 years.  Abdominal aortic aneurysm (AAA) screening. You may need this if you are a current or former smoker.  Osteoporosis. You may be screened starting at age 26 if you are at high risk. Talk with your health care  provider about your test results, treatment options, and if necessary, the need for more tests. Vaccines  Your health care provider may recommend certain vaccines, such as:  Influenza vaccine. This is recommended every year.  Tetanus, diphtheria, and acellular  pertussis (Tdap, Td) vaccine. You may need a Td booster every 10 years.  Zoster vaccine. You may need this after age 4.  Pneumococcal 13-valent conjugate (PCV13) vaccine. One dose is recommended after age 46.  Pneumococcal polysaccharide (PPSV23) vaccine. One dose is recommended after age 82. Talk to your health care provider about which screenings and vaccines you need and how often you need them. This information is not intended to replace advice given to you by your health care provider. Make sure you discuss any questions you have with your health care provider. Document Released: 06/22/2015 Document Revised: 02/13/2016 Document Reviewed: 03/27/2015 Elsevier Interactive Patient Education  2017 [employer] Prevention in the Home Falls can cause injuries. They can happen to people of all ages. There are many things you can do to make your home safe and to help prevent falls. What can I do on the outside of my home?  Regularly fix the edges of walkways and driveways and fix any cracks.  Remove anything that might make you trip as you walk through a door, such as a raised step or threshold.  Trim any bushes or trees on the path to your home.  Use bright outdoor lighting.  Clear any walking paths of anything that might make someone trip, such as rocks or tools.  Regularly check to see if handrails are loose or broken. Make sure that both sides of any steps have handrails.  Any raised decks and porches should have guardrails on the edges.  Have any leaves, snow, or ice cleared regularly.  Use sand or salt on walking paths during winter.  Clean up any spills in your garage right away. This includes oil or grease spills. What can I do in the bathroom?  Use night lights.  Install grab bars by the toilet and in the tub and shower. Do not use towel bars as grab bars.  Use non-skid mats or decals in the tub or shower.  If you need to sit down in the shower, use a plastic,  non-slip stool.  Keep the floor dry. Clean up any water that spills on the floor as soon as it happens.  Remove soap buildup in the tub or shower regularly.  Attach bath mats securely with double-sided non-slip rug tape.  Do not have throw rugs and other things on the floor that can make you trip. What can I do in the bedroom?  Use night lights.  Make sure that you have a light by your bed that is easy to reach.  Do not use any sheets or blankets that are too big for your bed. They should not hang down onto the floor.  Have a firm chair that has side arms. You can use this for support while you get dressed.  Do not have throw rugs and other things on the floor that can make you trip. What can I do in the kitchen?  Clean up any spills right away.  Avoid walking on wet floors.  Keep items that you use a lot in easy-to-reach places.  If you need to reach something above you, use a strong step stool that has a grab bar.  Keep electrical cords out of the way.  Do not use floor polish  or wax that makes floors slippery. If you must use wax, use non-skid floor wax.  Do not have throw rugs and other things on the floor that can make you trip. What can I do with my stairs?  Do not leave any items on the stairs.  Make sure that there are handrails on both sides of the stairs and use them. Fix handrails that are broken or loose. Make sure that handrails are as long as the stairways.  Check any carpeting to make sure that it is firmly attached to the stairs. Fix any carpet that is loose or worn.  Avoid having throw rugs at the top or bottom of the stairs. If you do have throw rugs, attach them to the floor with carpet tape.  Make sure that you have a light switch at the top of the stairs and the bottom of the stairs. If you do not have them, ask someone to add them for you. What else can I do to help prevent falls?  Wear shoes that:  Do not have high heels.  Have rubber  bottoms.  Are comfortable and fit you well.  Are closed at the toe. Do not wear sandals.  If you use a stepladder:  Make sure that it is fully opened. Do not climb a closed stepladder.  Make sure that both sides of the stepladder are locked into place.  Ask someone to hold it for you, if possible.  Clearly mark and make sure that you can see:  Any grab bars or handrails.  First and last steps.  Where the edge of each step is.  Use tools that help you move around (mobility aids) if they are needed. These include:  Canes.  Walkers.  Scooters.  Crutches.  Turn on the lights when you go into a dark area. Replace any light bulbs as soon as they burn out.  Set up your furniture so you have a clear path. Avoid moving your furniture around.  If any of your floors are uneven, fix them.  If there are any pets around you, be aware of where they are.  Review your medicines with your doctor. Some medicines can make you feel dizzy. This can increase your chance of falling. Ask your doctor what other things that you can do to help prevent falls. This information is not intended to replace advice given to you by your health care provider. Make sure you discuss any questions you have with your health care provider. Document Released: 03/22/2009 Document Revised: 11/01/2015 Document Reviewed: 06/30/2014 Elsevier Interactive Patient Education  2017 [employer].

## 2020-10-17 ENCOUNTER — Other Ambulatory Visit: Payer: Self-pay

## 2020-10-17 ENCOUNTER — Ambulatory Visit: Payer: Medicare PPO | Admitting: Dermatology

## 2020-10-17 DIAGNOSIS — L578 Other skin changes due to chronic exposure to nonionizing radiation: Secondary | ICD-10-CM | POA: Diagnosis not present

## 2020-10-17 DIAGNOSIS — Z872 Personal history of diseases of the skin and subcutaneous tissue: Secondary | ICD-10-CM

## 2020-10-17 DIAGNOSIS — L570 Actinic keratosis: Secondary | ICD-10-CM

## 2020-10-17 DIAGNOSIS — L820 Inflamed seborrheic keratosis: Secondary | ICD-10-CM

## 2020-10-17 NOTE — Progress Notes (Signed)
Follow-Up Visit   Subjective  Kevin Oneal is a 80 y.o. male who presents for the following: 2 month follow up (Patient  here today for 2 month follow up on left posterior thigh. He reports leg seems to be healing and is not having any new concerns. ).   The following portions of the chart were reviewed this encounter and updated as appropriate:  Tobacco  Allergies  Meds  Problems  Med Hx  Surg Hx  Fam Hx      Objective  Well appearing patient in no apparent distress; mood and affect are within normal limits.  A focused examination was performed including face. Relevant physical exam findings are noted in the Assessment and Plan.  Objective  Left Thigh - Posterior: Clear today   Objective  Left Malar Cheek: Erythematous thin papules/macules with gritty scale.   Assessment & Plan  Inflamed seborrheic keratosis Left Thigh - Posterior  Resolved s/p LN2  Actinic keratosis Left Malar Cheek  Actinic keratoses are precancerous spots that appear secondary to cumulative UV radiation exposure/sun exposure over time. They are chronic with expected duration over 1 year. A portion of actinic keratoses will progress to squamous cell carcinoma of the skin. It is not possible to reliably predict which spots will progress to skin cancer and so treatment is recommended to prevent development of skin cancer.  Recommend daily broad spectrum sunscreen SPF 30+ to sun-exposed areas, reapply every 2 hours as needed.  Recommend staying in the shade or wearing long sleeves, sun glasses (UVA+UVB protection) and wide brim hats (4-inch brim around the entire circumference of the hat). Call for new or changing lesions.  Patient deferred treatment at this time.   History of PreCancerous Actinic Keratosis  - site(s) of PreCancerous Actinic Keratosis clear today. At left cheek  - these may recur and new lesions may form requiring treatment to prevent transformation into skin cancer - observe  for new or changing spots and contact [place] for appointment if occur - photoprotection with sun protective clothing; sunglasses and broad spectrum sunscreen with SPF of at least 30 + and frequent self skin exams recommended - yearly exams by a dermatologist recommended for persons with history of PreCancerous Actinic Keratoses  Actinic Damage - Severe, confluent actinic changes with pre-cancerous actinic keratoses  - Severe, chronic, not at goal, secondary to cumulative UV radiation exposure over time - diffuse scaly erythematous macules and papules with underlying dyspigmentation - Discussed Prescription "Field Treatment" for Severe, Chronic Confluent Actinic Changes with Pre-Cancerous Actinic Keratoses including option of IMIQUIMOD to avoid significant inflammation or crusting Field treatment involves treatment of an entire area of skin that has confluent Actinic Changes (Sun/ Ultraviolet light damage) and PreCancerous Actinic Keratoses by method of PhotoDynamic Therapy (PDT) and/or prescription Topical Chemotherapy agents such as 5-fluorouracil, 5-fluorouracil/calcipotriene, and/or imiquimod.  The purpose is to decrease the number of clinically evident and subclinical PreCancerous lesions to prevent progression to development of skin cancer by chemically destroying early precancer changes that may or may not be visible.  It has been shown to reduce the risk of developing skin cancer in the treated area. As a result of treatment, redness, scaling, crusting, and open sores may occur during treatment course. One or more than one of these methods may be used and may have to be used several times to control, suppress and eliminate the PreCancerous changes. Discussed treatment course, expected reaction, and possible side effects. - Recommend daily broad spectrum sunscreen SPF 30+  to sun-exposed areas, reapply every 2 hours as needed.  - Staying in the shade or wearing long sleeves, sun glasses  (UVA+UVB protection) and wide brim hats (4-inch brim around the entire circumference of the hat) are also recommended. - Call for new or changing lesions. - Patient deferred treatment at this time. He also defers FBSE or regular dermatology checks at this time. He will call if he sees anything new or changing.  Return for follow up as needed .  I, Ruthell Rummage, CMA, am acting as scribe for Forest Gleason, MD.  Documentation: I have reviewed the above documentation for accuracy and completeness, and I agree with the above.  Forest Gleason, MD

## 2020-10-17 NOTE — Patient Instructions (Addendum)
Actinic keratoses are precancerous spots that appear secondary to cumulative UV radiation exposure/sun exposure over time. They are chronic with expected duration over 1 year. A portion of actinic keratoses will progress to squamous cell carcinoma of the skin. It is not possible to reliably predict which spots will progress to skin cancer and so treatment is recommended to prevent development of skin cancer.  Recommend daily broad spectrum sunscreen SPF 30+ to sun-exposed areas, reapply every 2 hours as needed.  Recommend staying in the shade or wearing long sleeves, sun glasses (UVA+UVB protection) and wide brim hats (4-inch brim around the entire circumference of the hat). Call for new or changing lesions.  Recommend Nicotinamide 500mg  twice per day to lower risk of non-melanoma skin cancer by approximately 25%.   Recommend taking Heliocare sun protection supplement daily in sunny weather for additional sun protection. For maximum protection on the sunniest days, you can take up to 2 capsules of regular Heliocare OR take 1 capsule of Heliocare Ultra. For prolonged exposure (such as a full day in the sun), you can repeat your dose of the supplement 4 hours after your first dose. Heliocare can be purchased at [facility] or at [email or web address].   If you have any questions or concerns for your doctor, please call our main line at [phone] and press option 4 to reach your doctor's medical assistant. If no one answers, please leave a voicemail as directed and we will return your call as soon as possible. Messages left after 4 pm will be answered the following business day.   You may also send [place] a message via [place]. We typically respond to MyChart messages within 1-2 business days.  For prescription refills, please ask your pharmacy to contact our office. Our fax number is [phone].  If you have an urgent issue when the clinic is closed that cannot wait until the next business day,  you can page your doctor at the number below.    Please note that while we do our best to be available for urgent issues outside of office hours, we are not available 24/7.   If you have an urgent issue and are unable to reach [place], you may choose to seek medical care at your doctor's office, retail clinic, urgent care center, or emergency room.  If you have a medical emergency, please immediately call 911 or go to the emergency department.  Pager Numbers  - Dr. Nehemiah Massed: [phone]  - Dr. Laurence Ferrari: [phone]  - Dr. Nicole Kindred: [phone]  In the event of inclement weather, please call our main line at [phone] for an update on the status of any delays or closures.  Dermatology Medication Tips: Please keep the boxes that topical medications come in in order to help keep track of the instructions about where and how to use these. Pharmacies typically print the medication instructions only on the boxes and not directly on the medication tubes.   If your medication is too expensive, please contact our office at [phone] option 4 or send [place] a message through [place].   We are unable to tell what your co-pay for medications will be in advance as this is different depending on your insurance coverage. However, we may be able to find a substitute medication at lower cost or fill out paperwork to get insurance to cover a needed medication.   If a prior authorization is required to get your medication covered by your insurance company, please allow [place] 1-2 business days to complete this  process.  Drug prices often vary depending on where the prescription is filled and some pharmacies may offer cheaper prices.  The website www.goodrx.com contains coupons for medications through different pharmacies. The prices here do not account for what the cost may be with help from insurance (it may be cheaper with your insurance), but the website can give you the price if you did not use any insurance.   - You can print the associated coupon and take it with your prescription to the pharmacy.  - You may also stop by our office during regular business hours and pick up a GoodRx coupon card.  - If you need your prescription sent electronically to a different pharmacy, notify our office through [facility] or by phone at [phone] option 4.

## 2020-10-22 ENCOUNTER — Other Ambulatory Visit: Payer: Self-pay

## 2020-10-22 ENCOUNTER — Ambulatory Visit
Admission: RE | Admit: 2020-10-22 | Discharge: 2020-10-22 | Disposition: A | Discharge status: Home / Self Care | Payer: Medicare PPO | Source: Ambulatory Visit | Attending: Family Medicine | Admitting: Family Medicine

## 2020-10-22 ENCOUNTER — Ambulatory Visit: Payer: Medicare PPO | Admitting: Family Medicine

## 2020-10-22 ENCOUNTER — Encounter: Payer: Self-pay | Admitting: Family Medicine

## 2020-10-22 ENCOUNTER — Ambulatory Visit
Admission: RE | Admit: 2020-10-22 | Discharge: 2020-10-22 | Disposition: A | Discharge status: Home / Self Care | Payer: Medicare PPO | Attending: Family Medicine | Admitting: Family Medicine

## 2020-10-22 VITALS — BP 140/80 | HR 88 | Temp 99.8°F | Ht 66.0 in | Wt 179.0 lb

## 2020-10-22 DIAGNOSIS — R509 Fever, unspecified: Secondary | ICD-10-CM | POA: Insufficient documentation

## 2020-10-22 DIAGNOSIS — R059 Cough, unspecified: Secondary | ICD-10-CM | POA: Diagnosis not present

## 2020-10-22 DIAGNOSIS — J0100 Acute maxillary sinusitis, unspecified: Secondary | ICD-10-CM | POA: Diagnosis not present

## 2020-10-22 MED ORDER — GUAIFENESIN-CODEINE 100-10 MG/5ML PO SYRP: 5.0000 mL | ORAL_SOLUTION | Freq: Four times a day (QID) | ORAL | 0 refills | Status: DC | PRN

## 2020-10-22 MED ORDER — AMOXICILLIN-POT CLAVULANATE 875-125 MG PO TABS: 1.0000 | ORAL_TABLET | Freq: Two times a day (BID) | ORAL | 0 refills | Status: DC

## 2020-10-22 NOTE — Progress Notes (Signed)
Date:  10/22/2020   Name:  Kevin Oneal   DOB:  08-16-1940   MRN:  [id number]   Chief Complaint: Cough (Sometimes can get something up with a dark yellow/ bloody tinge. Is flying out the first of June, so wants to get better. Astelin nasal spray doesn't help. Throat hurts from coughing and has some facial pressure)  sinusitis  Cough This is a new problem. The current episode started in the past 7 days. The problem has been gradually worsening. The cough is productive of purulent sputum. Associated symptoms include chills, ear pain, a fever, headaches, nasal congestion, postnasal drip, rhinorrhea and shortness of breath. Pertinent negatives include no chest pain, hemoptysis, myalgias, rash, sore throat, sweats or wheezing. He has tried nothing (ibuprofen) for the symptoms. The treatment provided moderate relief. There is no history of environmental allergies.  Sinusitis This is a new problem. The current episode started in the past 7 days. The problem has been waxing and waning since onset. Maximum temperature: lowgrade. Associated symptoms include chills, congestion, coughing, ear pain, headaches, shortness of breath and sinus pressure. Pertinent negatives include no diaphoresis, neck pain, sneezing or sore throat. The treatment provided moderate relief.    Lab Results  Component Value Date   CREATININE 0.83 08/10/2020   BUN 17 08/10/2020   NA 140 08/10/2020   K 4.6 08/10/2020   CL 102 08/10/2020   CO2 21 08/10/2020   Lab Results  Component Value Date   CHOL 206 (H) 08/10/2020   HDL 43 08/10/2020   LDLCALC 143 (H) 08/10/2020   TRIG 109 08/10/2020   CHOLHDL 4.0 08/09/2019   No results found for: TSH No results found for: HGBA1C Lab Results  Component Value Date   WBC 7.1 04/09/2020   HGB 16.8 04/09/2020   HCT 48.3 04/09/2020   MCV 90.6 04/09/2020   PLT 222 04/09/2020   Lab Results  Component Value Date   ALT 22 04/09/2020   AST 22 04/09/2020   ALKPHOS 75 04/09/2020    BILITOT 1.3 (H) 04/09/2020     Review of Systems  Constitutional: Positive for chills and fever. Negative for diaphoresis.  HENT: Positive for congestion, ear pain, postnasal drip, rhinorrhea and sinus pressure. Negative for drooling, ear discharge, sneezing and sore throat.   Respiratory: Positive for cough and shortness of breath. Negative for hemoptysis and wheezing.   Cardiovascular: Negative for chest pain, palpitations and leg swelling.  Gastrointestinal: Negative for abdominal pain, blood in stool, constipation, diarrhea and nausea.  Endocrine: Negative for polydipsia.  Genitourinary: Negative for dysuria, frequency, hematuria and urgency.  Musculoskeletal: Negative for back pain, myalgias and neck pain.  Skin: Negative for rash.  Allergic/Immunologic: Negative for environmental allergies.  Neurological: Positive for headaches. Negative for dizziness.  Hematological: Does not bruise/bleed easily.  Psychiatric/Behavioral: Negative for suicidal ideas. The patient is not nervous/anxious.     Patient Active Problem List   Diagnosis Date Noted  . Spinal stenosis of lumbar region 04/11/2020  . Body mass index (BMI) 28.0-28.9, adult 03/20/2020  . Elevated blood-pressure reading, without diagnosis of hypertension 03/20/2020  . Neurogenic claudication ([place]) 02/02/2020  . Rotator cuff syndrome of left shoulder 06/30/2018  . Primary osteoarthritis of first carpometacarpal joint of right hand 09/08/2017  . Hand pain, right 09/08/2017  . Pure hypercholesterolemia 07/30/2017  . OA (osteoarthritis) of knee 10/05/2013  . Personal history of other malignant neoplasm of skin 02/28/2013  . Barrett's esophagus 03/17/2012    No Known Allergies  Past  Surgical History:  Procedure Laterality Date  . CARPAL TUNNEL RELEASE Bilateral   . CATARACT EXTRACTION W/PHACO Right 10/19/2019   Procedure: CATARACT EXTRACTION PHACO AND INTRAOCULAR LENS PLACEMENT ([facility]) RIGHT 4.73 00:59.9 7.9%;  Surgeon:  Leandrew Koyanagi, MD;  Location: [place];  Service: Ophthalmology;  Laterality: Right;  . CATARACT EXTRACTION W/PHACO Left 12/28/2019   Procedure: CATARACT EXTRACTION PHACO AND INTRAOCULAR LENS PLACEMENT ([place]) LEFT TORIC LENS;  Surgeon: Leandrew Koyanagi, MD;  Location: [place];  Service: Ophthalmology;  Laterality: Left;  5.56 1:18.7 7.0%  . COLONOSCOPY  06/10/2011   Dr Candace Cruise- normal  . EYE SURGERY    . JOINT REPLACEMENT    . LUMBAR LAMINECTOMY/DECOMPRESSION MICRODISCECTOMY N/A 04/11/2020   Procedure: Laminectomy and Foraminotomy - Lumbar two-Lumbar three, Lumbar three-Lumbar four, Lumbar four-Lumbar five;  Surgeon: Kary Kos, MD;  Location: [place];  Service: Neurosurgery;  Laterality: N/A;  . PROSTATE SURGERY     removal of prostate due to cancer  . REPLACEMENT TOTAL KNEE BILATERAL    . UPPER GI ENDOSCOPY      Social History   Tobacco Use  . Smoking status: Former Smoker    Packs/day: 0.50    Years: 11.00    Pack years: 5.50    Types: Cigarettes    Quit date: 1980    Years since quitting: 42.4  . Smokeless tobacco: Never Used  . Tobacco comment: smoking cessation materials not required  Vaping Use  . Vaping Use: Never used  Substance Use Topics  . Alcohol use: Yes    Alcohol/week: 3.0 standard drinks    Types: 3 Shots of liquor per week    Comment: usually one drink every evening  . Drug use: No     Medication list has been reviewed and updated.  Current Meds  Medication Sig  . aspirin EC 81 MG tablet Take 81 mg by mouth daily.  Marland Kitchen azelastine (ASTELIN) 0.1 % nasal spray Place 1 spray into both nostrils 2 (two) times daily. Use in each nostril as directed  . ibuprofen (ADVIL) 200 MG tablet Take 400 mg by mouth every 6 (six) hours as needed for headache or moderate pain.   . Multiple Vitamins-Minerals (ONE-A-DAY MENS 50+ ADVANTAGE PO) Take 1 tablet by mouth daily.  . niacin 500 MG tablet Take 500 mg by mouth daily.   . Omega-3 Fatty Acids  (FISH OIL) 500 MG CAPS Take 1,000 mg by mouth daily.  . timolol (TIMOPTIC) 0.5 % ophthalmic solution Place 1 drop into both eyes at bedtime.     PHQ 2/9 Scores 10/22/2020 09/03/2020 08/10/2020 08/09/2019  PHQ - 2 Score 0 0 0 0  PHQ- 9 Score 0 0 2 1    GAD 7 : Generalized Anxiety Score 10/22/2020 08/10/2020 08/09/2019  Nervous, Anxious, on Edge 0 0 1  Control/stop worrying 0 1 1  Worry too much - different things 0 1 1  Trouble relaxing 0 0 1  Restless 0 0 0  Easily annoyed or irritable 0 1 1  Afraid - awful might happen 0 0 0  Total GAD 7 Score 0 3 5  Anxiety Difficulty - - Not difficult at all    BP Readings from Last 3 Encounters:  10/22/20 140/80  08/10/20 130/88  04/12/20 (!) 162/75    Physical Exam Vitals and nursing note reviewed.  HENT:     Head: Normocephalic.     Right Ear: Tympanic membrane and external ear normal.     Left Ear: Tympanic membrane  and external ear normal.     Nose: Congestion and rhinorrhea present.     Mouth/Throat:     Mouth: Mucous membranes are moist.  Eyes:     General: No scleral icterus.       Right eye: No discharge.        Left eye: No discharge.     Conjunctiva/sclera: Conjunctivae normal.     Pupils: Pupils are equal, round, and reactive to light.  Neck:     Thyroid: No thyromegaly.     Vascular: No JVD.     Trachea: No tracheal deviation.  Cardiovascular:     Rate and Rhythm: Normal rate and regular rhythm.     Heart sounds: Normal heart sounds. No murmur heard. No friction rub. No gallop.   Pulmonary:     Effort: No respiratory distress.     Breath sounds: Examination of the right-middle field reveals decreased breath sounds. Examination of the right-lower field reveals decreased breath sounds. Decreased breath sounds present. No wheezing, rhonchi or rales.  Abdominal:     General: Bowel sounds are normal.     Palpations: Abdomen is soft. There is no mass.     Tenderness: There is no abdominal tenderness. There is no guarding or  rebound.  Musculoskeletal:        General: No tenderness. Normal range of motion.     Cervical back: Normal range of motion and neck supple.  Lymphadenopathy:     Cervical: No cervical adenopathy.  Skin:    General: Skin is warm.     Findings: No rash.  Neurological:     Mental Status: He is alert and oriented to person, place, and time.     Cranial Nerves: No cranial nerve deficit.     Deep Tendon Reflexes: Reflexes are normal and symmetric.     Wt Readings from Last 3 Encounters:  10/22/20 179 lb (81.2 kg)  09/03/20 176 lb (79.8 kg)  08/10/20 177 lb (80.3 kg)    BP 140/80   Pulse 88   Temp 99.8 F (37.7 C) (Oral)   Ht 5\' 6"  (1.676 m)   Wt 179 lb (81.2 kg)   SpO2 96%   BMI 28.89 kg/m   Assessment and Plan:  1. Fever and chills 3-day history of fever and chills over the weekend thought there was likely a sinusitis comes today for evaluation.  Patient is noted to have fever with chills and shortness of breath when coming upstairs.  We will check for coronavirus and get a chest x-ray to assess for pneumonia. - Novel Coronavirus, NAA ([facility]) - DG Chest 2 View; Future  2. Cough New onset.  Persistent.  Patient has a productive cough presumably from postnasal drainage.  This is purulent in nature and we will treat with Robitussin-AC and obtain a chest x-ray to rule out pneumonia. - Novel Coronavirus, NAA ([facility]) - DG Chest 2 View; Future - guaiFENesin-codeine (ROBITUSSIN AC) 100-10 MG/5ML syrup; Take 5 mLs by mouth 4 (four) times daily as needed for cough.  Dispense: 118 mL; Refill: 0  3. Acute maxillary sinusitis, recurrence not specified New onset.  Persistent.  Stable.  Patient has discomfort behind right consistent with sphenoid ethmoid sinus concern.  Will obtain above chest x-ray but will initiate Augmentin 875 mg 1 twice a day for 10 days. - amoxicillin-clavulanate (AUGMENTIN) 875-125 MG tablet; Take 1 tablet by mouth 2 (two) times daily.  Dispense: 20 tablet;  Refill: 0

## 2020-10-22 NOTE — Patient Instructions (Signed)
COVID-19 Quarantine vs. Isolation QUARANTINE keeps someone who was in close contact with someone who has COVID-19 away from others. Quarantine if you have been in close contact with someone who has COVID-19, unless you have been fully vaccinated. If you are fully vaccinated  You do NOT need to quarantine unless they have symptoms  Get tested 3-5 days after your exposure, even if you don't have symptoms  Wear a mask indoors in public for 14 days following exposure or until your test result is negative If you are not fully vaccinated  Stay home for 14 days after your last contact with a person who has COVID-19  Watch for fever (100.4F), cough, shortness of breath, or other symptoms of COVID-19  If possible, stay away from people you live with, especially people who are at higher risk for getting very sick from COVID-19  Contact your local public health department for options in your area to possibly shorten your quarantine ISOLATION keeps someone who is sick or tested positive for COVID-19 without symptoms away from others, even in their own home. People who are in isolation should stay home and stay in a specific "sick room" or area and use a separate bathroom (if available). If you are sick and think or know you have COVID-19 Stay home until after  At least 10 days since symptoms first appeared and  At least 24 hours with no fever without the use of fever-reducing medications and  Symptoms have improved If you tested positive for COVID-19 but do not have symptoms  Stay home until after 10 days have passed since your positive viral test  If you develop symptoms after testing positive, follow the steps above for those who are sick cdc.gov/coronavirus 03/05/2020 This information is not intended to replace advice given to you by your health care provider. Make sure you discuss any questions you have with your health care provider. Document Revised: 04/09/2020 Document Reviewed:  04/09/2020 Elsevier Patient Education  2021 [employer]vier Inc.  

## 2020-10-23 LAB — SARS-COV-2, NAA 2 DAY TAT

## 2020-10-23 LAB — NOVEL CORONAVIRUS, NAA: SARS-CoV-2, NAA: NOT DETECTED

## 2020-10-24 ENCOUNTER — Telehealth: Payer: Self-pay | Admitting: Family Medicine

## 2020-10-24 NOTE — Telephone Encounter (Signed)
Negative COVID results given. Patient results "NOT Detected." Caller expressed understanding. ° °

## 2020-10-25 NOTE — Telephone Encounter (Signed)
Called with continue antibiotic

## 2020-10-26 ENCOUNTER — Encounter: Payer: Self-pay | Admitting: Dermatology

## 2020-11-01 NOTE — Telephone Encounter (Signed)
Called pt he stated he is not having any symptoms at this time that he does not have anymore green mucous. Told pt that if he is not having any symptoms that he does not need anymore antibiotics at this time. Pt verbalized understanding.  KP

## 2020-11-01 NOTE — Telephone Encounter (Signed)
Pt called to speak with Tara/ he stated today is his last day he has any antibiotics left/ Pt is going to [place] and wants to know if he should get another refill to take with him or want Dr. Ronnald Ramp advises/ please advise

## 2021-01-31 DIAGNOSIS — I739 Peripheral vascular disease, unspecified: Secondary | ICD-10-CM | POA: Diagnosis not present

## 2021-01-31 DIAGNOSIS — Z6829 Body mass index (BMI) 29.0-29.9, adult: Secondary | ICD-10-CM | POA: Diagnosis not present

## 2021-01-31 DIAGNOSIS — R030 Elevated blood-pressure reading, without diagnosis of hypertension: Secondary | ICD-10-CM | POA: Diagnosis not present

## 2021-01-31 DIAGNOSIS — E785 Hyperlipidemia, unspecified: Secondary | ICD-10-CM | POA: Diagnosis not present

## 2021-01-31 DIAGNOSIS — M1990 Unspecified osteoarthritis, unspecified site: Secondary | ICD-10-CM | POA: Diagnosis not present

## 2021-01-31 DIAGNOSIS — Z7722 Contact with and (suspected) exposure to environmental tobacco smoke (acute) (chronic): Secondary | ICD-10-CM | POA: Diagnosis not present

## 2021-01-31 DIAGNOSIS — J301 Allergic rhinitis due to pollen: Secondary | ICD-10-CM | POA: Diagnosis not present

## 2021-01-31 DIAGNOSIS — E663 Overweight: Secondary | ICD-10-CM | POA: Diagnosis not present

## 2021-01-31 DIAGNOSIS — H409 Unspecified glaucoma: Secondary | ICD-10-CM | POA: Diagnosis not present

## 2021-02-04 DIAGNOSIS — H40053 Ocular hypertension, bilateral: Secondary | ICD-10-CM | POA: Diagnosis not present

## 2021-04-17 ENCOUNTER — Telehealth: Payer: Self-pay

## 2021-04-17 NOTE — Telephone Encounter (Signed)
Copied from [place] [id number]. Topic: General - Other >> Apr 17, 2021 10:24 AM Celene Kras wrote: Reason for CRM: Pt called stating that he would like to have his flu shot added to his record and is requesting to have a call back. Pt was very upset that he tried to reach out yesterday to have this added and wasn't able to get through due to high call volume. Apologized to pt. Please advise.

## 2021-05-06 ENCOUNTER — Ambulatory Visit: Payer: Medicare PPO | Admitting: Family Medicine

## 2021-05-06 ENCOUNTER — Other Ambulatory Visit: Payer: Self-pay

## 2021-05-06 ENCOUNTER — Encounter: Payer: Self-pay | Admitting: Family Medicine

## 2021-05-06 VITALS — BP 130/98 | HR 64 | Temp 98.4°F | Ht 66.0 in | Wt 178.0 lb

## 2021-05-06 DIAGNOSIS — R059 Cough, unspecified: Secondary | ICD-10-CM | POA: Diagnosis not present

## 2021-05-06 DIAGNOSIS — J0100 Acute maxillary sinusitis, unspecified: Secondary | ICD-10-CM

## 2021-05-06 MED ORDER — GUAIFENESIN-CODEINE 100-10 MG/5ML PO SYRP: 5.0000 mL | ORAL_SOLUTION | Freq: Four times a day (QID) | ORAL | 0 refills | Status: DC | PRN

## 2021-05-06 MED ORDER — AMOXICILLIN-POT CLAVULANATE 875-125 MG PO TABS: 1.0000 | ORAL_TABLET | Freq: Two times a day (BID) | ORAL | 0 refills | Status: DC

## 2021-05-06 NOTE — Progress Notes (Signed)
Date:  05/06/2021   Name:  Kevin Oneal   DOB:  May 21, 1941   MRN:  [id number]   Chief Complaint: Cough (X5 days, Chest congestion, no fever)  Cough This is a new problem. The current episode started in the past 7 days. The problem has been gradually worsening. The problem occurs every few minutes. The cough is Productive of sputum. Associated symptoms include a sore throat. Pertinent negatives include no chest pain, chills, ear pain, fever, headaches, hemoptysis, myalgias, postnasal drip, rash, shortness of breath, weight loss or wheezing. Nothing aggravates the symptoms. Treatments tried: Tylenol. The treatment provided mild relief. There is no history of environmental allergies.   Lab Results  Component Value Date   NA 140 08/10/2020   K 4.6 08/10/2020   CO2 21 08/10/2020   GLUCOSE 85 08/10/2020   BUN 17 08/10/2020   CREATININE 0.83 08/10/2020   CALCIUM 9.6 08/10/2020   EGFR 88 08/10/2020   GFRNONAA >60 04/09/2020   Lab Results  Component Value Date   CHOL 206 (H) 08/10/2020   HDL 43 08/10/2020   LDLCALC 143 (H) 08/10/2020   TRIG 109 08/10/2020   CHOLHDL 4.0 08/09/2019   No results found for: TSH No results found for: HGBA1C Lab Results  Component Value Date   WBC 7.1 04/09/2020   HGB 16.8 04/09/2020   HCT 48.3 04/09/2020   MCV 90.6 04/09/2020   PLT 222 04/09/2020   Lab Results  Component Value Date   ALT 22 04/09/2020   AST 22 04/09/2020   ALKPHOS 75 04/09/2020   BILITOT 1.3 (H) 04/09/2020   No components found for: VITD  Review of Systems  Constitutional:  Negative for chills, fever and weight loss.  HENT:  Positive for sore throat. Negative for drooling, ear discharge, ear pain and postnasal drip.   Respiratory:  Positive for cough. Negative for hemoptysis, shortness of breath and wheezing.   Cardiovascular:  Negative for chest pain, palpitations and leg swelling.  Gastrointestinal:  Negative for abdominal pain, blood in stool, constipation, diarrhea  and nausea.  Endocrine: Negative for polydipsia.  Genitourinary:  Negative for dysuria, frequency, hematuria and urgency.  Musculoskeletal:  Negative for back pain, myalgias and neck pain.  Skin:  Negative for rash.  Allergic/Immunologic: Negative for environmental allergies.  Neurological:  Negative for dizziness and headaches.  Hematological:  Does not bruise/bleed easily.  Psychiatric/Behavioral:  Negative for suicidal ideas. The patient is not nervous/anxious.    Patient Active Problem List   Diagnosis Date Noted   Spinal stenosis of lumbar region 04/11/2020   Body mass index (BMI) 28.0-28.9, adult 03/20/2020   Elevated blood-pressure reading, without diagnosis of hypertension 03/20/2020   Neurogenic claudication (HCC) 02/02/2020   Rotator cuff syndrome of left shoulder 06/30/2018   Primary osteoarthritis of first carpometacarpal joint of right hand 09/08/2017   Hand pain, right 09/08/2017   Pure hypercholesterolemia 07/30/2017   OA (osteoarthritis) of knee 10/05/2013   Personal history of other malignant neoplasm of skin 02/28/2013   Barrett's esophagus 03/17/2012    No Known Allergies  Past Surgical History:  Procedure Laterality Date   CARPAL TUNNEL RELEASE Bilateral    CATARACT EXTRACTION W/PHACO Right 10/19/2019   Procedure: CATARACT EXTRACTION PHACO AND INTRAOCULAR LENS PLACEMENT (IOC) RIGHT 4.73 00:59.9 7.9%;  Surgeon: Lockie Mola, MD;  Location[facility];  Service: Ophthalmology;  Laterality: Right;   CATARACT EXTRACTION W/PHACO Left 12/28/2019   Procedure: CATARACT EXTRACTION PHACO AND INTRAOCULAR LENS PLACEMENT (I[facility]) LEFT TORIC LENS;  Surgeon: Leandrew Koyanagi, MD;  Location: [place];  Service: Ophthalmology;  Laterality: Left;  5.56 1:18.7 7.0%   COLONOSCOPY  06/10/2011   Dr Candace Cruise- normal   EYE SURGERY     JOINT REPLACEMENT     LUMBAR LAMINECTOMY/DECOMPRESSION MICRODISCECTOMY N/A 04/11/2020   Procedure: Laminectomy and Foraminotomy  - Lumbar two-Lumbar three, Lumbar three-Lumbar four, Lumbar four-Lumbar five;  Surgeon: Kary Kos, MD;  Location: [place];  Service: Neurosurgery;  Laterality: N/A;   PROSTATE SURGERY     removal of prostate due to cancer   REPLACEMENT TOTAL KNEE BILATERAL     UPPER GI ENDOSCOPY      Social History   Tobacco Use   Smoking status: Former    Packs/day: 0.50    Years: 11.00    Pack years: 5.50    Types: Cigarettes    Quit date: 1980    Years since quitting: 42.9   Smokeless tobacco: Never   Tobacco comments:    smoking cessation materials not required  Vaping Use   Vaping Use: Never used  Substance Use Topics   Alcohol use: Yes    Alcohol/week: 3.0 standard drinks    Types: 3 Shots of liquor per week    Comment: usually one drink every evening   Drug use: No     Medication list has been reviewed and updated.  No outpatient medications have been marked as taking for the 05/06/21 encounter (Office Visit) with Juline Patch, MD.    [facility] [place] 2/9 Scores 10/22/2020 09/03/2020 08/10/2020 08/09/2019  PHQ - 2 Score 0 0 0 0  PHQ- 9 Score 0 0 2 1    GAD 7 : Generalized Anxiety Score 10/22/2020 08/10/2020 08/09/2019  Nervous, Anxious, on Edge 0 0 1  Control/stop worrying 0 1 1  Worry too much - different things 0 1 1  Trouble relaxing 0 0 1  Restless 0 0 0  Easily annoyed or irritable 0 1 1  Afraid - awful might happen 0 0 0  Total GAD 7 Score 0 3 5  Anxiety Difficulty - - Not difficult at all    BP Readings from Last 3 Encounters:  10/22/20 140/80  08/10/20 130/88  04/12/20 (!) 162/75    Physical Exam Vitals and nursing note reviewed.  HENT:     Head: Normocephalic.     Right Ear: Tympanic membrane, ear canal and external ear normal.     Left Ear: Tympanic membrane, ear canal and external ear normal.     Nose:     Right Turbinates: Swollen.     Left Turbinates: Swollen.     Right Sinus: No maxillary sinus tenderness or frontal sinus tenderness.     Left Sinus: Maxillary sinus  tenderness present. No frontal sinus tenderness.     Mouth/Throat:     Mouth: Mucous membranes are moist.  Eyes:     General: No scleral icterus.       Right eye: No discharge.        Left eye: No discharge.     Conjunctiva/sclera: Conjunctivae normal.     Pupils: Pupils are equal, round, and reactive to light.  Neck:     Thyroid: No thyromegaly.     Vascular: No carotid bruit or JVD.     Trachea: No tracheal deviation.  Cardiovascular:     Rate and Rhythm: Normal rate and regular rhythm.     Heart sounds: Normal heart sounds. No murmur heard.   No friction rub. No gallop.  Pulmonary:     Effort: No respiratory distress.     Breath sounds: Normal breath sounds. No wheezing, rhonchi or rales.  Chest:     Chest wall: No tenderness.  Abdominal:     General: Bowel sounds are normal.     Palpations: Abdomen is soft. There is no mass.     Tenderness: There is no abdominal tenderness. There is no guarding or rebound.  Musculoskeletal:        General: No tenderness. Normal range of motion.     Cervical back: Normal range of motion and neck supple.  Lymphadenopathy:     Cervical: No cervical adenopathy.     Right cervical: No superficial, deep or posterior cervical adenopathy.    Left cervical: No superficial, deep or posterior cervical adenopathy.     Upper Body:     Right upper body: No supraclavicular adenopathy.     Left upper body: No supraclavicular adenopathy.  Skin:    General: Skin is warm.     Findings: No rash.  Neurological:     Mental Status: He is alert and oriented to person, place, and time.     Cranial Nerves: No cranial nerve deficit.     Deep Tendon Reflexes: Reflexes are normal and symmetric.    Wt Readings from Last 3 Encounters:  10/22/20 179 lb (81.2 kg)  09/03/20 176 lb (79.8 kg)  08/10/20 177 lb (80.3 kg)    Ht $R'5\' 6"'Fa$  (1.676 m)   BMI 28.89 kg/m   Assessment and Plan:  1. Acute maxillary sinusitis, recurrence not specified New onset.   Persistent.  Patient has tenderness over the maxillary sinus on the left.  We will treat with Augmentin 875 mg 1 twice a day for 10 days. - amoxicillin-clavulanate (AUGMENTIN) 875-125 MG tablet; Take 1 tablet by mouth 2 (two) times daily.  Dispense: 20 tablet; Refill: 0  2. Cough New onset.  Persistent.  Stable.  But is particularly bothersome at night.  We will treat with Robitussin-AC a teaspoon every 6 hours as needed for cough. - guaiFENesin-codeine (ROBITUSSIN AC) 100-10 MG/5ML syrup; Take 5 mLs by mouth 4 (four) times daily as needed for cough.  Dispense: 118 mL; Refill: 0

## 2021-05-30 ENCOUNTER — Ambulatory Visit: Payer: Self-pay | Admitting: *Deleted

## 2021-05-30 NOTE — Telephone Encounter (Signed)
Reason for Disposition  [1] Shingles rash (matches SYMPTOMS) AND [2] onset < 72 hours ago (3 days)  Answer Assessment - Initial Assessment Questions 1. APPEARANCE of RASH: "Describe the rash."      A couple of days ago it started on my left jaw and cheek and behind left ear.   It's very uncomfortable.   I wonder if it's shingles.   It's very painful to touch.   I feels similar to when I had shingles before. 2. LOCATION: "Where is the rash located?"      [place] 3. ONSET: "When did the rash start?"      A couple of days ago 4. ITCHING: "Does the rash itch?" If Yes, ask: "How bad is the itch?"  (Scale 1-10; or mild, moderate, severe)     A little 5. PAIN: "Does the rash hurt?" If Yes, ask: "How bad is the pain?"  (Scale 0-10; or none, mild, moderate, severe)    - NONE (0): no pain    - MILD (1-3): doesn't interfere with normal activities     - MODERATE (4-7): interferes with normal activities or awakens from sleep     - SEVERE (8-10): excruciating pain, unable to do any normal activities     Painful to touch. 6. OTHER SYMPTOMS: "Do you have any other symptoms?" (e.g., fever)     It's in my hairline too so it's hard to see. 7. PREGNANCY: "Is there any chance you are pregnant?" "When was your last menstrual period?"     N/A  Protocols used: Shingles (Zoster)-A-AH  Chief Complaint: possible shingles on left side of head Symptoms: discomfort that is intense, mild itching, very painful to the touch Frequency: constantly Pertinent Negatives: Patient denies N/A Disposition: [] ED /[] Urgent Care (no appt availability in office) / [x] Appointment(In office/virtual)/ []  [facility]/ [] Home Care/ [] Refused Recommended Disposition  Additional Notes: I made him an appt with Dr. Otilio Miu for 05/31/2021 at 8:20.   I called into [facility] to see if he could be seen today but she was booked.   I went over the care advice with him.

## 2021-05-31 ENCOUNTER — Encounter: Payer: Self-pay | Admitting: Family Medicine

## 2021-05-31 ENCOUNTER — Ambulatory Visit: Payer: Medicare PPO | Admitting: Family Medicine

## 2021-05-31 ENCOUNTER — Other Ambulatory Visit: Payer: Self-pay

## 2021-05-31 VITALS — BP 138/80 | HR 64 | Ht 66.0 in | Wt 183.0 lb

## 2021-05-31 DIAGNOSIS — B029 Zoster without complications: Secondary | ICD-10-CM

## 2021-05-31 MED ORDER — TRAMADOL HCL 50 MG PO TABS: 50.0000 mg | ORAL_TABLET | Freq: Four times a day (QID) | ORAL | 0 refills | Status: AC | PRN

## 2021-05-31 MED ORDER — VALACYCLOVIR HCL 1 G PO TABS: 1000.0000 mg | ORAL_TABLET | Freq: Three times a day (TID) | ORAL | 0 refills | Status: DC

## 2021-05-31 NOTE — Progress Notes (Signed)
zoster   Date:  05/31/2021   Name:  Kevin Oneal   DOB:  [birth year]   MRN:  [id number]   Chief Complaint: Herpes Zoster (Back of neck x 3 days with pain)  Rash This is a new problem. The current episode started in the past 7 days (3 days ago). The problem has been gradually worsening since onset. The affected locations include the scalp and head. The rash is characterized by redness (cluster). Pertinent negatives include no cough, diarrhea, fever, shortness of breath or sore throat. (Localized pain) Past treatments include nothing. The treatment provided mild relief.   Lab Results  Component Value Date   NA 140 08/10/2020   K 4.6 08/10/2020   CO2 21 08/10/2020   GLUCOSE 85 08/10/2020   BUN 17 08/10/2020   CREATININE 0.83 08/10/2020   CALCIUM 9.6 08/10/2020   EGFR 88 08/10/2020   GFRNONAA >60 04/09/2020   Lab Results  Component Value Date   CHOL 206 (H) 08/10/2020   HDL 43 08/10/2020   LDLCALC 143 (H) 08/10/2020   TRIG 109 08/10/2020   CHOLHDL 4.0 08/09/2019   No results found for: TSH No results found for: HGBA1C Lab Results  Component Value Date   WBC 7.1 04/09/2020   HGB 16.8 04/09/2020   HCT 48.3 04/09/2020   MCV 90.6 04/09/2020   PLT 222 04/09/2020   Lab Results  Component Value Date   ALT 22 04/09/2020   AST 22 04/09/2020   ALKPHOS 75 04/09/2020   BILITOT 1.3 (H) 04/09/2020   No results found for: 25OHVITD2, 25OHVITD3, VD25OH   Review of Systems  Constitutional:  Negative for chills and fever.  HENT:  Negative for drooling, ear discharge, ear pain and sore throat.   Respiratory:  Negative for cough, shortness of breath and wheezing.   Cardiovascular:  Negative for chest pain, palpitations and leg swelling.  Gastrointestinal:  Negative for abdominal pain, blood in stool, constipation, diarrhea and nausea.  Endocrine: Negative for polydipsia.  Genitourinary:  Negative for dysuria, frequency, hematuria and urgency.  Musculoskeletal:  Negative for back  pain, myalgias and neck pain.  Skin:  Positive for rash.  Allergic/Immunologic: Negative for environmental allergies.  Neurological:  Negative for dizziness and headaches.  Hematological:  Does not bruise/bleed easily.  Psychiatric/Behavioral:  Negative for suicidal ideas. The patient is not nervous/anxious.    Patient Active Problem List   Diagnosis Date Noted   Spinal stenosis of lumbar region 04/11/2020   Body mass index (BMI) 28.0-28.9, adult 03/20/2020   Elevated blood-pressure reading, without diagnosis of hypertension 03/20/2020   Neurogenic claudication ([place]) 02/02/2020   Rotator cuff syndrome of left shoulder 06/30/2018   Primary osteoarthritis of first carpometacarpal joint of right hand 09/08/2017   Hand pain, right 09/08/2017   Pure hypercholesterolemia 07/30/2017   OA (osteoarthritis) of knee 10/05/2013   Personal history of other malignant neoplasm of skin 02/28/2013   Barrett's esophagus 03/17/2012    No Known Allergies  Past Surgical History:  Procedure Laterality Date   CARPAL TUNNEL RELEASE Bilateral    CATARACT EXTRACTION W/PHACO Right 10/19/2019   Procedure: CATARACT EXTRACTION PHACO AND INTRAOCULAR LENS PLACEMENT (IOC) RIGHT 4.73 00:59.9 7.9%;  Surgeon: Leandrew Koyanagi, MD;  Location: [place];  Service: Ophthalmology;  Laterality: Right;   CATARACT EXTRACTION W/PHACO Left 12/28/2019   Procedure: CATARACT EXTRACTION PHACO AND INTRAOCULAR LENS PLACEMENT ([place]) LEFT TORIC LENS;  Surgeon: Leandrew Koyanagi, MD;  Location: [place];  Service: Ophthalmology;  Laterality: Left;  5.56  1:18.7 7.0%   COLONOSCOPY  06/10/2011   Dr Candace Cruise- normal   EYE SURGERY     JOINT REPLACEMENT     LUMBAR LAMINECTOMY/DECOMPRESSION MICRODISCECTOMY N/A 04/11/2020   Procedure: Laminectomy and Foraminotomy - Lumbar two-Lumbar three, Lumbar three-Lumbar four, Lumbar four-Lumbar five;  Surgeon: Kary Kos, MD;  Location: [place];  Service: Neurosurgery;  Laterality:  N/A;   PROSTATE SURGERY     removal of prostate due to cancer   REPLACEMENT TOTAL KNEE BILATERAL     UPPER GI ENDOSCOPY      Social History   Tobacco Use   Smoking status: Former    Packs/day: 0.50    Years: 11.00    Pack years: 5.50    Types: Cigarettes    Quit date: 1980    Years since quitting: 43.0   Smokeless tobacco: Never   Tobacco comments:    smoking cessation materials not required  Vaping Use   Vaping Use: Never used  Substance Use Topics   Alcohol use: Yes    Alcohol/week: 3.0 standard drinks    Types: 3 Shots of liquor per week    Comment: usually one drink every evening   Drug use: No     Medication list has been reviewed and updated.  Current Meds  Medication Sig   Acetaminophen (TYLENOL EXTRA STRENGTH PO) Take by mouth as needed.   aspirin EC 81 MG tablet Take 81 mg by mouth daily.   ibuprofen (ADVIL) 200 MG tablet Take 400 mg by mouth every 6 (six) hours as needed for headache or moderate pain.    Multiple Vitamins-Minerals (ONE-A-DAY MENS 50+ ADVANTAGE PO) Take 1 tablet by mouth daily.   niacin 500 MG tablet Take 500 mg by mouth daily.    Omega-3 Fatty Acids (FISH OIL) 500 MG CAPS Take 1,000 mg by mouth daily.   timolol (TIMOPTIC) 0.5 % ophthalmic solution Place 1 drop into both eyes at bedtime.     PHQ 2/9 Scores 05/06/2021 10/22/2020 09/03/2020 08/10/2020  PHQ - 2 Score 0 0 0 0  PHQ- 9 Score 3 0 0 2    GAD 7 : Generalized Anxiety Score 05/06/2021 10/22/2020 08/10/2020 08/09/2019  Nervous, Anxious, on Edge 0 0 0 1  Control/stop worrying 0 0 1 1  Worry too much - different things 0 0 1 1  Trouble relaxing 0 0 0 1  Restless 0 0 0 0  Easily annoyed or irritable 0 0 1 1  Afraid - awful might happen 0 0 0 0  Total GAD 7 Score 0 0 3 5  Anxiety Difficulty - - - Not difficult at all    BP Readings from Last 3 Encounters:  05/31/21 138/80  05/06/21 (!) 130/98  10/22/20 140/80    Physical Exam Vitals and nursing note reviewed.  HENT:     Head:  Normocephalic.     Right Ear: Tympanic membrane, ear canal and external ear normal. There is no impacted cerumen.     Left Ear: Tympanic membrane, ear canal and external ear normal. There is no impacted cerumen.     Nose: Nose normal. No congestion or rhinorrhea.  Eyes:     General: No scleral icterus.       Right eye: No discharge.        Left eye: No discharge.     Conjunctiva/sclera: Conjunctivae normal.     Pupils: Pupils are equal, round, and reactive to light.  Neck:     Thyroid: No thyromegaly.  Vascular: No JVD.     Trachea: No tracheal deviation.  Cardiovascular:     Rate and Rhythm: Normal rate and regular rhythm.     Heart sounds: Normal heart sounds. No murmur heard.   No friction rub. No gallop.  Pulmonary:     Effort: No respiratory distress.     Breath sounds: Normal breath sounds. No wheezing or rales.  Abdominal:     General: Bowel sounds are normal.     Palpations: Abdomen is soft. There is no mass.     Tenderness: There is no abdominal tenderness. There is no guarding or rebound.  Musculoskeletal:        General: No tenderness. Normal range of motion.     Cervical back: Normal range of motion and neck supple.  Lymphadenopathy:     Cervical: No cervical adenopathy.  Skin:    General: Skin is warm.     Findings: Erythema and rash present. Rash is vesicular.  Neurological:     Mental Status: He is alert and oriented to person, place, and time.     Cranial Nerves: No cranial nerve deficit.     Deep Tendon Reflexes: Reflexes are normal and symmetric.    Wt Readings from Last 3 Encounters:  05/31/21 183 lb (83 kg)  05/06/21 178 lb (80.7 kg)  10/22/20 179 lb (81.2 kg)    BP 138/80    Pulse 64    Ht $R'5\' 6"'Ri$  (1.676 m)    Wt 183 lb (83 kg)    BMI 29.54 kg/m   Assessment and Plan: 1. Herpes zoster without complication New onset.  Persistent.  Relatively stable but painful.  Patient has a history of what sounds like Zostavax and had breakthrough.  We have  discussed Shingrix but he will have to wait for resolution of the illness and any residual pain for at least 6 weeks.  Patient will inquire with his pharmacist if he qualifies.  In the meantime we will treat with Valtrex 1 g every 8 hours for 10 days and patient is also being given tramadol to be used on a as needed basis every 6 hours over the course of 5 days for severe pain. - valACYclovir (VALTREX) 1000 MG tablet; Take 1 tablet (1,000 mg total) by mouth 3 (three) times daily.  Dispense: 30 tablet; Refill: 0 - traMADol (ULTRAM) 50 MG tablet; Take 1 tablet (50 mg total) by mouth every 6 (six) hours as needed for up to 5 days.  Dispense: 20 tablet; Refill: 0

## 2021-06-06 ENCOUNTER — Ambulatory Visit: Payer: Self-pay | Admitting: *Deleted

## 2021-06-06 NOTE — Telephone Encounter (Signed)
No contact encounter. Baxter Flattery has already spoken with the patient regarding his question.   Reason for Disposition  Caller has already spoken with the PCP and has no further questions.  Answer Assessment - Initial Assessment Questions N/A  Protocols used: No Contact or Duplicate Contact Call-A-AH

## 2021-08-12 DIAGNOSIS — H35371 Puckering of macula, right eye: Secondary | ICD-10-CM | POA: Diagnosis not present

## 2021-08-13 ENCOUNTER — Encounter: Payer: Self-pay | Admitting: Family Medicine

## 2021-08-13 ENCOUNTER — Other Ambulatory Visit: Payer: Self-pay

## 2021-08-13 ENCOUNTER — Ambulatory Visit (INDEPENDENT_AMBULATORY_CARE_PROVIDER_SITE_OTHER): Payer: Medicare PPO | Admitting: Family Medicine

## 2021-08-13 VITALS — BP 130/62 | HR 64 | Ht 66.0 in | Wt 177.0 lb

## 2021-08-13 DIAGNOSIS — E785 Hyperlipidemia, unspecified: Secondary | ICD-10-CM | POA: Diagnosis not present

## 2021-08-13 DIAGNOSIS — E7800 Pure hypercholesterolemia, unspecified: Secondary | ICD-10-CM | POA: Diagnosis not present

## 2021-08-13 DIAGNOSIS — Z0000 Encounter for general adult medical examination without abnormal findings: Secondary | ICD-10-CM

## 2021-08-13 DIAGNOSIS — F5101 Primary insomnia: Secondary | ICD-10-CM | POA: Diagnosis not present

## 2021-08-13 DIAGNOSIS — I10 Essential (primary) hypertension: Secondary | ICD-10-CM | POA: Diagnosis not present

## 2021-08-13 LAB — HEMOCCULT GUIAC POC 1CARD (OFFICE): Fecal Occult Blood, POC: NEGATIVE

## 2021-08-13 NOTE — Progress Notes (Signed)
? ? ?Date:  08/13/2021  ? ?Name:  Kevin Oneal   DOB:  12-21-1940   MRN:  [id number] ? ? ?Chief Complaint: Annual Exam and Insomnia (Partner has dementia and is causing him not to be able to sleep well) ? ?Patient is a 81 year old male who presents for a comprehensive physical exam. The patient reports the following problems: insomnia. Health maintenance has been reviewed up to date. ? Kevin Oneal is a 81 y.o. male who presents today for his Complete Annual Exam. He feels well. He reports exercising . He reports he is sleeping poorly.   ? ?Insomnia ?Primary symptoms: difficulty falling asleep, premature morning awakening.   ?The problem occurs nightly.  ? ?Lab Results  ?Component Value Date  ? NA 140 08/10/2020  ? K 4.6 08/10/2020  ? CO2 21 08/10/2020  ? GLUCOSE 85 08/10/2020  ? BUN 17 08/10/2020  ? CREATININE 0.83 08/10/2020  ? CALCIUM 9.6 08/10/2020  ? EGFR 88 08/10/2020  ? GFRNONAA >60 04/09/2020  ? ?Lab Results  ?Component Value Date  ? CHOL 206 (H) 08/10/2020  ? HDL 43 08/10/2020  ? LDLCALC 143 (H) 08/10/2020  ? TRIG 109 08/10/2020  ? CHOLHDL 4.0 08/09/2019  ? ?No results found for: TSH ?No results found for: HGBA1C ?Lab Results  ?Component Value Date  ? WBC 7.1 04/09/2020  ? HGB 16.8 04/09/2020  ? HCT 48.3 04/09/2020  ? MCV 90.6 04/09/2020  ? PLT 222 04/09/2020  ? ?Lab Results  ?Component Value Date  ? ALT 22 04/09/2020  ? AST 22 04/09/2020  ? ALKPHOS 75 04/09/2020  ? BILITOT 1.3 (H) 04/09/2020  ? ?No results found for: 25OHVITD2, [place], VD25OH  ? ?Review of Systems  ?Constitutional:  Negative for chills and fever.  ?HENT:  Negative for drooling, ear discharge, ear pain and sore throat.   ?Respiratory:  Negative for cough, shortness of breath and wheezing.   ?Cardiovascular:  Negative for chest pain, palpitations and leg swelling.  ?Gastrointestinal:  Negative for abdominal pain, blood in stool, constipation, diarrhea and nausea.  ?Endocrine: Negative for polydipsia.  ?Genitourinary:  Negative for  dysuria, frequency, hematuria and urgency.  ?Musculoskeletal:  Negative for back pain, myalgias and neck pain.  ?Skin:  Negative for rash.  ?Allergic/Immunologic: Negative for environmental allergies.  ?Neurological:  Negative for dizziness and headaches.  ?Hematological:  Does not bruise/bleed easily.  ?Psychiatric/Behavioral:  Negative for suicidal ideas. The patient has insomnia. The patient is not nervous/anxious.   ? ?Patient Active Problem List  ? Diagnosis Date Noted  ? Spinal stenosis of lumbar region 04/11/2020  ? Body mass index (BMI) 28.0-28.9, adult 03/20/2020  ? Elevated blood-pressure reading, without diagnosis of hypertension 03/20/2020  ? Neurogenic claudication ([place]) 02/02/2020  ? Rotator cuff syndrome of left shoulder 06/30/2018  ? Primary osteoarthritis of first carpometacarpal joint of right hand 09/08/2017  ? Hand pain, right 09/08/2017  ? Pure hypercholesterolemia 07/30/2017  ? OA (osteoarthritis) of knee 10/05/2013  ? Personal history of other malignant neoplasm of skin 02/28/2013  ? Barrett's esophagus 03/17/2012  ? ? ?No Known Allergies ? ?Past Surgical History:  ?Procedure Laterality Date  ? CARPAL TUNNEL RELEASE Bilateral   ? CATARACT EXTRACTION W/PHACO Right 10/19/2019  ? Procedure: CATARACT EXTRACTION PHACO AND INTRAOCULAR LENS PLACEMENT ([facility]) RIGHT 4.73 00:59.9 7.9%;  Surgeon: Leandrew Koyanagi, MD;  Location: [facility];  Service: Ophthalmology;  Laterality: Right;  ? CATARACT EXTRACTION W/PHACO Left 12/28/2019  ? Procedure: CATARACT EXTRACTION PHACO AND INTRAOCULAR LENS  PLACEMENT ([place]) LEFT TORIC LENS;  Surgeon: Leandrew Koyanagi, MD;  Location: [facility];  Service: Ophthalmology;  Laterality: Left;  5.56 ?1:18.7 ?7.0%  ? COLONOSCOPY  06/10/2011  ? Dr Candace Cruise- normal  ? EYE SURGERY    ? JOINT REPLACEMENT    ? LUMBAR LAMINECTOMY/DECOMPRESSION MICRODISCECTOMY N/A 04/11/2020  ? Procedure: Laminectomy and Foraminotomy - Lumbar two-Lumbar three, Lumbar three-Lumbar four,  Lumbar four-Lumbar five;  Surgeon: Kary Kos, MD;  Location: [facility];  Service: Neurosurgery;  Laterality: N/A;  ? PROSTATE SURGERY    ? removal of prostate due to cancer  ? REPLACEMENT TOTAL KNEE BILATERAL    ? UPPER GI ENDOSCOPY    ? ? ?Social History  ? ?Tobacco Use  ? Smoking status: Former  ?  Packs/day: 0.50  ?  Years: 11.00  ?  Pack years: 5.50  ?  Types: Cigarettes  ?  Quit date: 32  ?  Years since quitting: 43.2  ? Smokeless tobacco: Never  ? Tobacco comments:  ?  smoking cessation materials not required  ?Vaping Use  ? Vaping Use: Never used  ?Substance Use Topics  ? Alcohol use: Yes  ?  Alcohol/week: 3.0 standard drinks  ?  Types: 3 Shots of liquor per week  ?  Comment: usually one drink every evening  ? Drug use: No  ? ? ? ?Medication list has been reviewed and updated. ? ?Current Meds  ?Medication Sig  ? Acetaminophen (TYLENOL EXTRA STRENGTH PO) Take by mouth as needed.  ? aspirin EC 81 MG tablet Take 81 mg by mouth daily.  ? ibuprofen (ADVIL) 200 MG tablet Take 400 mg by mouth every 6 (six) hours as needed for headache or moderate pain.   ? Multiple Vitamins-Minerals (ONE-A-DAY MENS 50+ ADVANTAGE PO) Take 1 tablet by mouth daily.  ? niacin 500 MG tablet Take 500 mg by mouth daily.   ? Omega-3 Fatty Acids (FISH OIL) 500 MG CAPS Take 1,000 mg by mouth daily.  ? timolol (TIMOPTIC) 0.5 % ophthalmic solution Place 1 drop into both eyes at bedtime.   ? ? ?PHQ 2/9 Scores 05/06/2021 10/22/2020 09/03/2020 08/10/2020  ?PHQ - 2 Score 0 0 0 0  ?PHQ- 9 Score 3 0 0 2  ? ? ?GAD 7 : Generalized Anxiety Score 05/06/2021 10/22/2020 08/10/2020 08/09/2019  ?Nervous, Anxious, on Edge 0 0 0 1  ?Control/stop worrying 0 0 1 1  ?Worry too much - different things 0 0 1 1  ?Trouble relaxing 0 0 0 1  ?Restless 0 0 0 0  ?Easily annoyed or irritable 0 0 1 1  ?Afraid - awful might happen 0 0 0 0  ?Total GAD 7 Score 0 0 3 5  ?Anxiety Difficulty - - - Not difficult at all  ? ? ?BP Readings from Last 3 Encounters:  ?08/13/21 130/62  ?05/31/21  138/80  ?05/06/21 (!) 130/98  ? ? ?Physical Exam ? ?Wt Readings from Last 3 Encounters:  ?08/13/21 177 lb (80.3 kg)  ?05/31/21 183 lb (83 kg)  ?05/06/21 178 lb (80.7 kg)  ? ? ?BP 130/62   Pulse 64   Ht _0  (1.676 m)   Wt 177 lb (80.3 kg)   BMI 28.57 kg/m?  ? ?Assessment and Plan: ?Patient's chart was reviewed for previous encounters most recent labs most recent imaging in [place].Patient is a 81year old male who presents for a comprehensive physical exam. The patient reports the following problems: None. Health maintenance has been reviewed up-to-date.[employer]  is a 81 y.o. male who presents today for his Complete Annual Exam. He feels well. He reports exercising . He reports he is sleeping poorly.   ?1. Annual physical exam ?Immunizations are reviewed and recommendations provided.   Age appropriate screening tests are discussed. Counseling given for risk factor reduction interventions.  No subjective-objective concerns noted during history of present illness, review of systems, review of past medical history, and physical exam.  Annual physical exam labs will include lipid panel and renal function panel. ?- POCT Occult Blood Stool ?- Lipid Panel With LDL/HDL Ratio ?- Renal Function Panel ? ?2. Primary insomnia ?New onset.  Patient is having difficulty initiating sleep and maintaining sleep.  We have offered trazodone but patient would like to try over-the-counter melatonin first and information has been provided as well as information in preparation for insomnia. ? ?3. Pure hypercholesterolemia ?As noted patient has had elevated cholesterol readings in the past and we we will check lipid panel at this time for evaluation ?- Lipid Panel With LDL/HDL Ratio  ? ? ?

## 2021-08-13 NOTE — Patient Instructions (Addendum)
Melatonin Capsules or Tablets What is this medication? MELATONIN (mel uh TOH nin) is promoted for sleep disorders, such as insomnia or jet lag. Melatonin helps regulate your sleep cycle. This supplement is not intended to diagnose, treat, cure, or prevent any disease. This medicine may be used for other purposes; ask your health care provider or pharmacist if you have questions. COMMON BRAND NAME(S): Melatonex What should I tell my care team before I take this medication? They need to know if you have any of these conditions: Cancer Depression or mental illness Diabetes Hormone problems If you often drink alcohol Immune system problems Liver disease Lung or breathing disease, like asthma Organ transplant Seizure disorder An unusual or allergic reaction to melatonin, other medications, foods, dyes, or preservatives Pregnant or trying to get pregnant Breast-feeding How should I use this medication? Take this medication by mouth with a glass of water. Do not take with food. This medication is usually taken 1 or 2 hours before bedtime. After taking this medication, limit your activities to those needed to prepare for bed. Some products may be chewed or dissolved in the mouth before swallowing. Some tablets or capsules must be swallowed whole; do not cut, crush or chew. Follow the directions on the package labeling, or take as directed by your care team. Do not take this medication more often than directed. Talk to your care team the use of this medication in children. Special care may be needed. This medication is not recommended for use in children without a prescription. Overdosage: If you think you have taken too much of this medicine contact a poison control center or emergency room at once. NOTE: This medicine is only for you. Do not share this medicine with others. What if I miss a dose? If you miss taking your dose at the usual time, skip that dose. If it is almost time for your next  dose, take only that dose. Do not take double or extra doses. What may interact with this medication? Do not take this medication with any of the following: Fluvoxamine Ramelteon Tasimelteon This medication may also interact with the following: Alcohol Caffeine Carbamazepine Certain antibiotics like ciprofloxacin Certain medications for depression, anxiety, or psychotic disturbances Cimetidine Male hormones, like estrogens and birth control pills, patches, rings, or injections Methoxsalen Nifedipine Other herbal or dietary supplements Other medications for sleep Phenobarbital Rifampin Smoking tobacco Tamoxifen Warfarin This list may not describe all possible interactions. Give your health care provider a list of all the medicines, herbs, non-prescription drugs, or dietary supplements you use. Also tell them if you smoke, drink alcohol, or use illegal drugs. Some items may interact with your medicine. What should I watch for while using this medication? See your care team if your symptoms do not get better or if they get worse. Do not take this medication for more than 2 weeks unless your care team tells you to. You may get drowsy or dizzy. Do not drive, use machinery, or do anything that needs mental alertness until you know how this medication affects you. Do not stand or sit up quickly, especially if you are an older patient. This reduces the risk of dizzy or fainting spells. Alcohol may interfere with the effect of this medication. Avoid alcoholic drinks. After taking this medication, you may get up out of bed and do an activity that you do not know you are doing. The next morning, you may have no memory of this. Activities include driving a car ("sleep-driving"), making and  eating food, talking on the phone, sexual activity, and sleep-walking. Serious injuries have occurred. Call your care team right away if you find out you have done any of these activities. Do not take this  medication if you have used alcohol that evening. Do not take it if you have taken another medication for sleep. The risk of doing these sleep-related activities is higher. Talk to your care team before you use this medication if you are currently being treated for an emotional, mental, or sleep problem. This medication may interfere with your treatment. Herbal or dietary supplements are not regulated like medications. Rigid quality control standards are not required for dietary supplements. The purity and strength of these products can vary. The safety and effect of this dietary supplement for a certain disease or illness is not well known. This product is not intended to diagnose, treat, cure or prevent any disease. The Food and Drug Administration suggests the following to help consumers protect themselves: Always read product labels and follow directions. Natural does not mean a product is safe for humans to take. Look for products that include USP after the ingredient name. This means that the manufacturer followed the standards of the [place] Pharmacopoeia. Supplements made or sold by a nationally known food or drug company are more likely to be made under tight controls. You can write to the company for more information about how the product was made. What side effects may I notice from receiving this medication? Side effects that you should report to your care team as soon as possible: Allergic reactions--skin rash, itching, hives, swelling of the face, lips, tongue, or throat Mood and behavior changes--anxiety, nervousness, confusion, hallucinations, irritability, hostility, thoughts of suicide or self-harm, worsening mood, feelings of depression Side effects that usually do not require medical attention (report to your care team if they continue or are bothersome): Bedwetting in children Dizziness Drowsiness the day after use Headache Nausea This list may not describe all possible side  effects. Call your doctor for medical advice about side effects. You may report side effects to FDA at 1-800-FDA-1088. Where should I keep my medication? Keep out of the reach of children. Store at room temperature or as directed on the package label. Protect from moisture. Throw away any unused medication after the expiration date. NOTE: This sheet is a summary. It may not cover all possible information. If you have questions about this medicine, talk to your doctor, pharmacist, or health care provider.  2022 Elsevier/Gold Standard (2020-10-10 00:00:00) Insomnia Insomnia is a sleep disorder that makes it difficult to fall asleep or stay asleep. Insomnia can cause fatigue, low energy, difficulty concentrating, mood swings, and poor performance at work or school. There are three different ways to classify insomnia: Difficulty falling asleep. Difficulty staying asleep. Waking up too early in the morning. Any type of insomnia can be long-term (chronic) or short-term (acute). Both are common. Short-term insomnia usually lasts for three months or less. Chronic insomnia occurs at least three times a week for longer than three months. What are the causes? Insomnia may be caused by another condition, situation, or substance, such as: Anxiety. Certain medicines. Gastroesophageal reflux disease (GERD) or other gastrointestinal conditions. Asthma or other breathing conditions. Restless legs syndrome, sleep apnea, or other sleep disorders. Chronic pain. Menopause. Stroke. Abuse of alcohol, tobacco, or illegal drugs. Mental health conditions, such as depression. Caffeine. Neurological disorders, such as Alzheimer's disease. An overactive thyroid (hyperthyroidism). Sometimes, the cause of insomnia may not be known. What  increases the risk? Risk factors for insomnia include: Gender. Women are affected more often than men. Age. Insomnia is more common as you get older. Stress. Lack of  exercise. Irregular work schedule or working night shifts. Traveling between different time zones. Certain medical and mental health conditions. What are the signs or symptoms? If you have insomnia, the main symptom is having trouble falling asleep or having trouble staying asleep. This may lead to other symptoms, such as: Feeling fatigued or having low energy. Feeling nervous about going to sleep. Not feeling rested in the morning. Having trouble concentrating. Feeling irritable, anxious, or depressed. How is this diagnosed? This condition may be diagnosed based on: Your symptoms and medical history. Your health care provider may ask about: Your sleep habits. Any medical conditions you have. Your mental health. A physical exam. How is this treated? Treatment for insomnia depends on the cause. Treatment may focus on treating an underlying condition that is causing insomnia. Treatment may also include: Medicines to help you sleep. Counseling or therapy. Lifestyle adjustments to help you sleep better. Follow these instructions at home: Eating and drinking  Limit or avoid alcohol, caffeinated beverages, and cigarettes, especially close to bedtime. These can disrupt your sleep. Do not eat a large meal or eat spicy foods right before bedtime. This can lead to digestive discomfort that can make it hard for you to sleep. Sleep habits  Keep a sleep diary to help you and your health care provider figure out what could be causing your insomnia. Write down: When you sleep. When you wake up during the night. How well you sleep. How rested you feel the next day. Any side effects of medicines you are taking. What you eat and drink. Make your bedroom a dark, comfortable place where it is easy to fall asleep. Put up shades or blackout curtains to block light from outside. Use a white noise machine to block noise. Keep the temperature cool. Limit screen use before bedtime. This  includes: Watching TV. Using your smartphone, tablet, or computer. Stick to a routine that includes going to bed and waking up at the same times every day and night. This can help you fall asleep faster. Consider making a quiet activity, such as reading, part of your nighttime routine. Try to avoid taking naps during the day so that you sleep better at night. Get out of bed if you are still awake after 15 minutes of trying to sleep. Keep the lights down, but try reading or doing a quiet activity. When you feel sleepy, go back to bed. General instructions Take over-the-counter and prescription medicines only as told by your health care provider. Exercise regularly, as told by your health care provider. Avoid exercise starting several hours before bedtime. Use relaxation techniques to manage stress. Ask your health care provider to suggest some techniques that may work well for you. These may include: Breathing exercises. Routines to release muscle tension. Visualizing peaceful scenes. Make sure that you drive carefully. Avoid driving if you feel very sleepy. Keep all follow-up visits as told by your health care provider. This is important. Contact a health care provider if: You are tired throughout the day. You have trouble in your daily routine due to sleepiness. You continue to have sleep problems, or your sleep problems get worse. Get help right away if: You have serious thoughts about hurting yourself or someone else. If you ever feel like you may hurt yourself or others, or have thoughts about taking your own  life, get help right away. You can go to your nearest emergency department or call: Your local emergency services (911 in the [place] A suicide crisis helpline, such as the [place] at (513)610-0318 or 988 in the [place]. This is open 24 hours a day. Summary Insomnia is a sleep disorder that makes it difficult to fall asleep or stay asleep. Insomnia can be  long-term (chronic) or short-term (acute). Treatment for insomnia depends on the cause. Treatment may focus on treating an underlying condition that is causing insomnia. Keep a sleep diary to help you and your health care provider figure out what could be causing your insomnia. This information is not intended to replace advice given to you by your health care provider. Make sure you discuss any questions you have with your health care provider. Document Revised: 12/19/2020 Document Reviewed: 04/05/2020 Elsevier Patient Education  2022 [employer].

## 2021-08-14 LAB — LIPID PANEL WITH LDL/HDL RATIO
Cholesterol, Total: 209 mg/dL — ABNORMAL HIGH (ref 100–199)
HDL: 41 mg/dL (ref >39)
LDL Chol Calc (NIH): 152 mg/dL — ABNORMAL HIGH (ref 0–99)
LDL/HDL Ratio: 3.7 ratio — ABNORMAL HIGH (ref 0.0–3.6)
Triglycerides: 91 mg/dL (ref 0–149)
VLDL Cholesterol Cal: 16 mg/dL (ref 5–40)

## 2021-08-14 LAB — RENAL FUNCTION PANEL
Albumin: 4.6 g/dL (ref 3.6–4.6)
BUN/Creatinine Ratio: 15 (ref 10–24)
BUN: 15 mg/dL (ref 8–27)
CO2: 22 mmol/L (ref 20–29)
Calcium: 9.2 mg/dL (ref 8.6–10.2)
Chloride: 101 mmol/L (ref 96–106)
Creatinine, Ser: 1 mg/dL (ref 0.76–1.27)
Glucose: 90 mg/dL (ref 70–99)
Phosphorus: 3.3 mg/dL (ref 2.8–4.1)
Potassium: 4.3 mmol/L (ref 3.5–5.2)
Sodium: 138 mmol/L (ref 134–144)
eGFR: 75 mL/min/{1.73_m2} (ref >59)

## 2021-09-04 ENCOUNTER — Ambulatory Visit: Payer: Medicare PPO

## 2021-10-26 IMAGING — MR MR LUMBAR SPINE W/O CM
4 of 5 series · 25 of 48 positions shown · non-contrast
Comparison: None.

CLINICAL DATA: 79-year-old male with central low back pain,
bilateral leg and buttock pain.

EXAM:
MRI LUMBAR SPINE WITHOUT CONTRAST
TECHNIQUE: Multiplanar, multisequence MR imaging of the lumbar spine was
performed. No intravenous contrast was administered.

[Series 3: T2 post-contrast · sagittal · 4.0mm · 0.53mm/px · 8 of 18 slices shown]
[im 1/18]
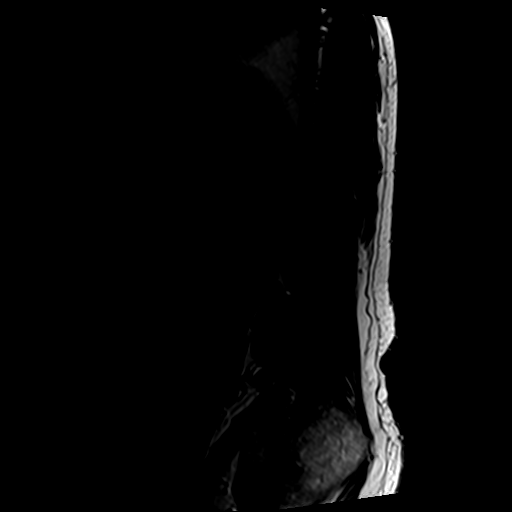
[im 3/18]
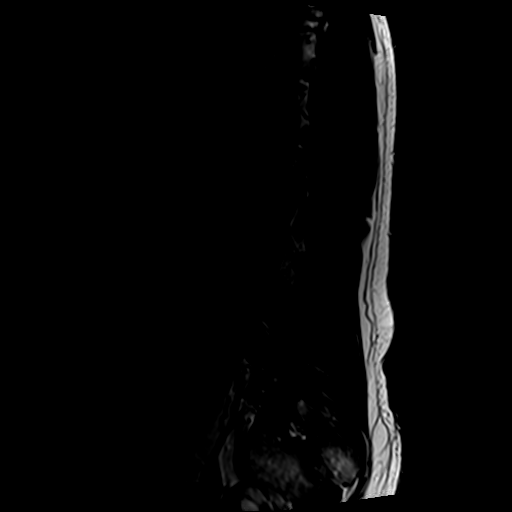
[im 5/18]
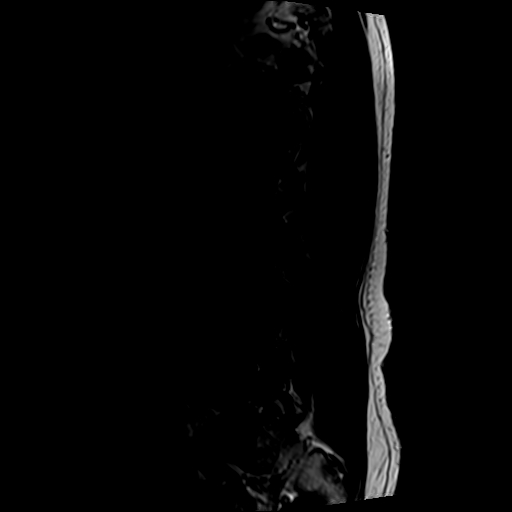
[im 8/18]
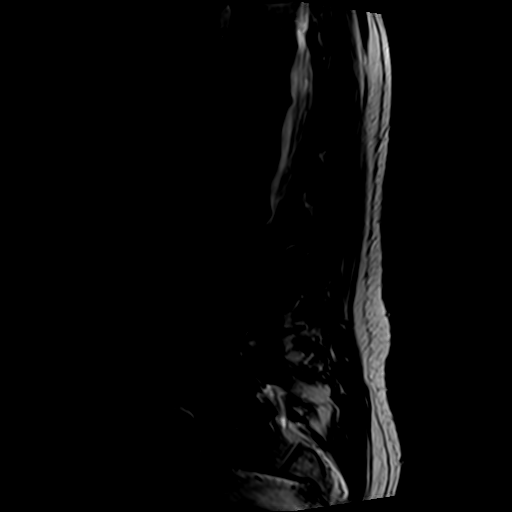
[im 10/18]
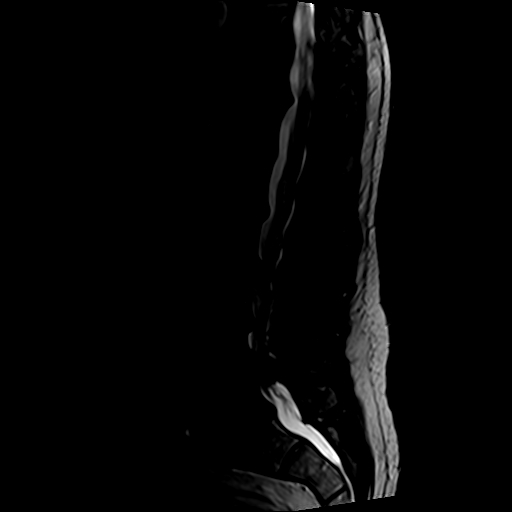
[im 13/18]
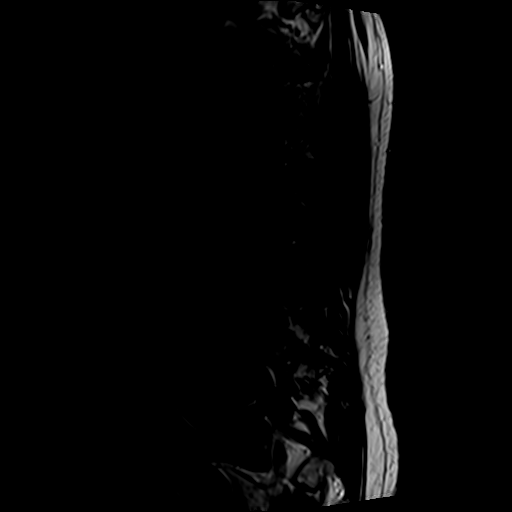
[im 15/18]
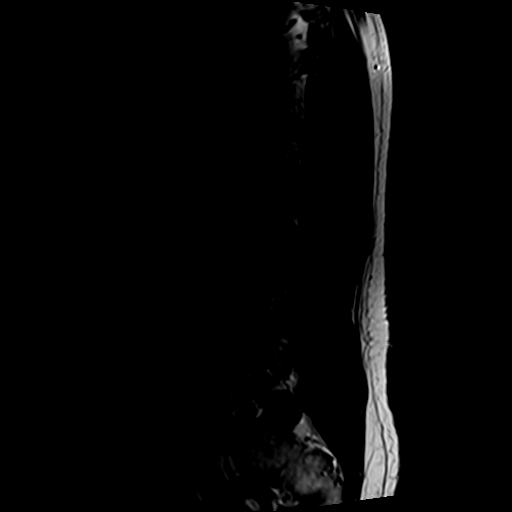
[im 18/18]
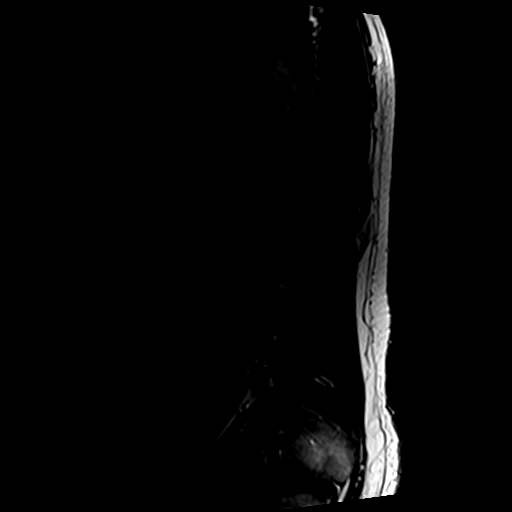

[Series 5: T1 · sagittal · 4.0mm · 0.53mm/px · 5 of 18 slices shown (1 of 2)]
[im 1/18]
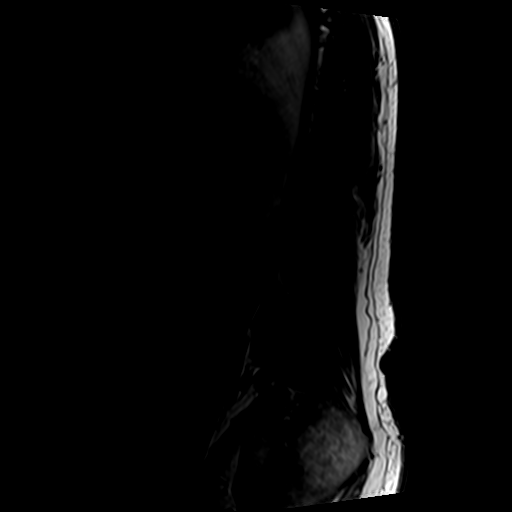
[im 3/18]
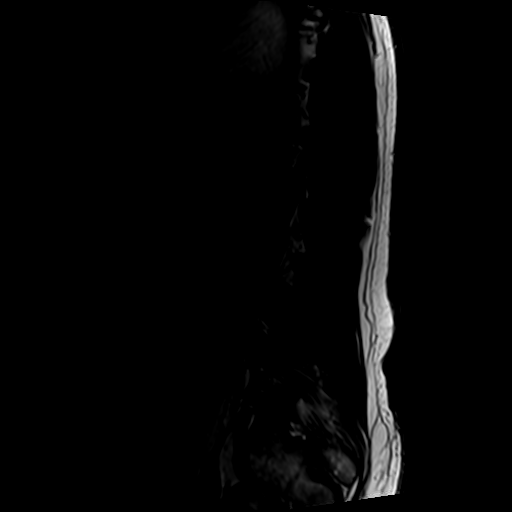
[im 6/18]
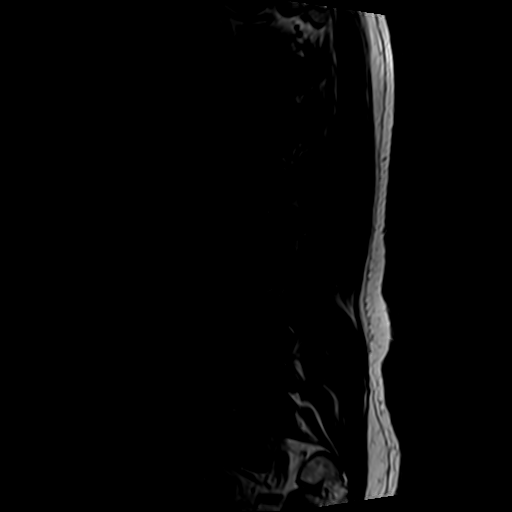
[im 9/18]
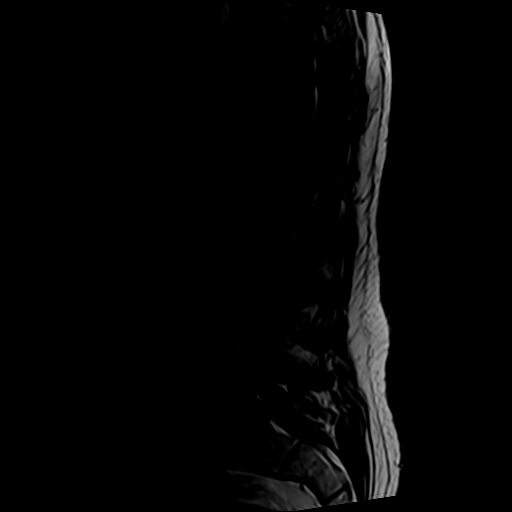
[im 15/18]
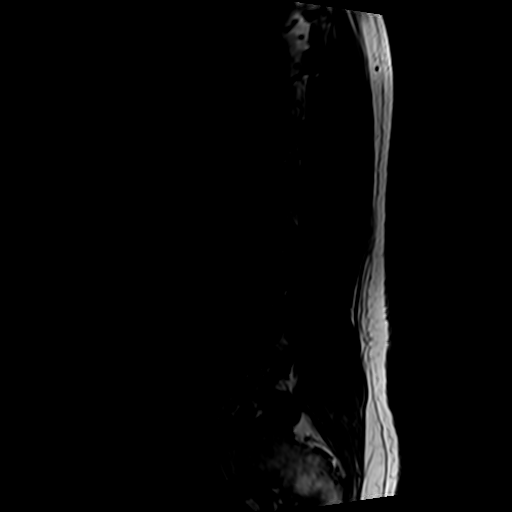

[Series 6: T2 · axial · 4.0mm · 0.70mm/px · z∈[-142,+59]mm · 9 of 33 slices shown]
[im 1/33]
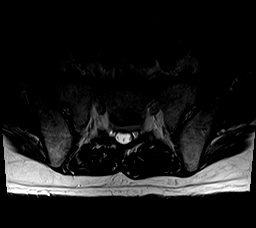
[im 6/33]
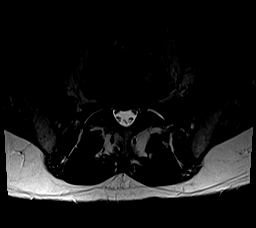
[im 11/33]
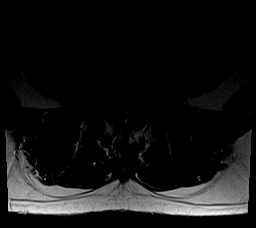
[im 14/33]
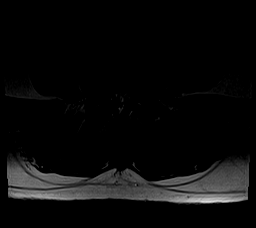
[im 17/33]
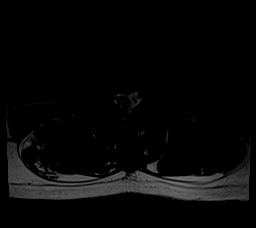
[im 19/33]
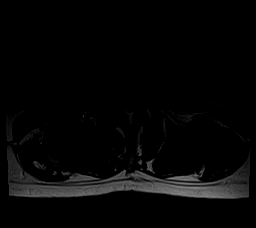
[im 22/33]
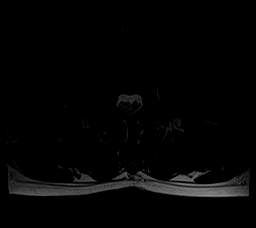
[im 27/33]
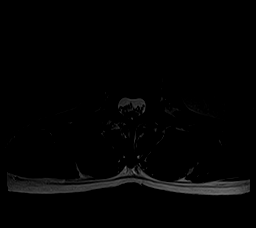
[im 33/33]
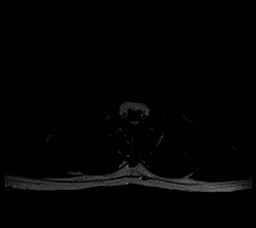

[Series 7: T1 · axial · 4.0mm · 0.35mm/px · z∈[-117,+28]mm · 3 of 33 slices shown (2 of 2)]
[im 6/33]
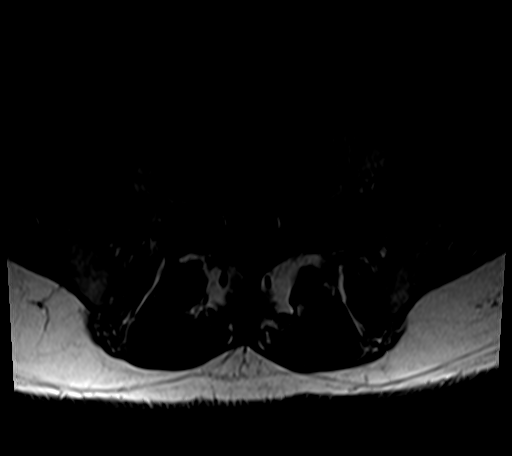
[im 17/33]
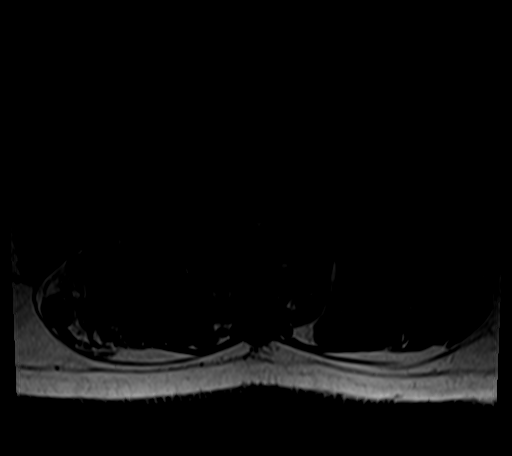
[im 27/33]
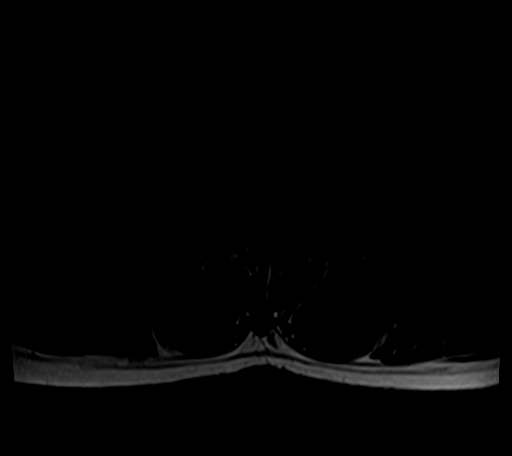

[25 of 48 positions shown; findings below may reference images not displayed]

FINDINGS: Segmentation: Lumbar segmentation appears to be normal and will be
designated as such for this report.

Alignment: Anterolisthesis of L4 on L5 measures 6-7 mm. Subtle
retrolisthesis of L3 on L4, and L5 on S1. Elsewhere mild
straightening of lumbar lordosis.

Vertebrae: Faint degenerative marrow edema at the L2 posteroinferior
endplate. No other convincing No marrow edema or evidence of acute
osseous abnormality. Background bone marrow signal is within normal
limits. Intact visible sacrum and SI joints.

Conus medullaris and cauda equina: Conus extends to the T12-L1
level. No lower spinal cord or conus signal abnormality.

Paraspinal and other soft tissues: Negative; small benign appearing
exophytic right renal midpole cyst and tortuous aortoiliac arteries.

Disc levels:

Mild for age visible lower thoracic spine degeneration, no lower
thoracic spinal stenosis.

T12-L1:  Negative.

L1-L2: Disc space loss with circumferential disc bulge. Mild
endplate spurring and posterior element hypertrophy. No significant
spinal stenosis. Mild bilateral L1 foraminal stenosis.

L2-L3: Disc space loss. Left eccentric circumferential disc
bulge/disc osteophyte complex. Broad-based left subarticular
component. Mild to moderate posterior element hypertrophy. Moderate
spinal and left lateral recess stenosis (left L3 nerve level).
Moderate bilateral L2 foraminal stenosis.

L3-L4: Bulky circumferential disc bulge, broad-based and foraminal
component. Moderate posterior element hypertrophy. Moderate spinal
and bilateral lateral recess stenosis. Moderate to severe bilateral
L3 foraminal stenosis.

L4-L5: Grade 1 anterolisthesis. Severe disc loss. Circumferential
disc/pseudo disc with endplate spurring and severe posterior element
hypertrophy. Capacious facet joints containing fluid (series 6,
image 24).

Severe spinal, bilateral lateral recess (L5 nerve levels) and right
greater than left L4 foraminal stenosis.

L5-S1: Disc space loss with circumferential disc osteophyte complex.
Moderate facet hypertrophy. Degenerative facet joint fluid. No
spinal or lateral recess stenosis. Moderate to severe bilateral L5
foraminal stenosis.
IMPRESSION: 1. Advanced lumbar spine degeneration with multifactorial spinal
stenosis L2-L3 through L4-L5.
Grade 1 anterolisthesis at L4-L5 with Severe spinal, lateral recess
and foraminal stenosis. Query L4 and/or L5 radiculitis.

2. Moderate to severe neural foraminal foraminal stenosis also at
L5-S1.

## 2021-11-05 DIAGNOSIS — M542 Cervicalgia: Secondary | ICD-10-CM | POA: Diagnosis not present

## 2021-11-18 ENCOUNTER — Ambulatory Visit: Payer: Medicare PPO | Admitting: Family Medicine

## 2021-11-18 ENCOUNTER — Encounter: Payer: Self-pay | Admitting: Family Medicine

## 2021-11-18 VITALS — BP 130/70 | HR 80 | Ht 66.0 in | Wt 178.0 lb

## 2021-11-18 DIAGNOSIS — J0100 Acute maxillary sinusitis, unspecified: Secondary | ICD-10-CM

## 2021-11-18 DIAGNOSIS — R051 Acute cough: Secondary | ICD-10-CM | POA: Diagnosis not present

## 2021-11-18 MED ORDER — PROMETHAZINE-DM 6.25-15 MG/5ML PO SYRP: 5.0000 mL | ORAL_SOLUTION | Freq: Four times a day (QID) | ORAL | 0 refills | Status: DC | PRN

## 2021-11-18 MED ORDER — AMOXICILLIN-POT CLAVULANATE 875-125 MG PO TABS: 1.0000 | ORAL_TABLET | Freq: Two times a day (BID) | ORAL | 0 refills | Status: DC

## 2021-11-18 NOTE — Progress Notes (Signed)
Date:  11/18/2021   Name:  Kevin Oneal   DOB:  1941/04/02   MRN:  [id number]   Chief Complaint: Cough (X 1 week and 1/2- green and yellow production from cough)  Cough This is a new problem. The current episode started 1 to 4 weeks ago (10 days). The problem has been gradually improving. The problem occurs constantly. The cough is Productive of purulent sputum. Associated symptoms include headaches. Pertinent negatives include no chest pain, chills, ear congestion, ear pain, fever, myalgias, nasal congestion, rash, sore throat, shortness of breath, sweats or wheezing. There is no history of environmental allergies.  Sinusitis The current episode started 1 to 4 weeks ago. The problem has been gradually worsening since onset. There has been no fever. Associated symptoms include congestion, coughing, headaches, sinus pressure and sneezing. Pertinent negatives include no chills, ear pain, neck pain, shortness of breath or sore throat.    Lab Results  Component Value Date   NA 138 08/13/2021   K 4.3 08/13/2021   CO2 22 08/13/2021   GLUCOSE 90 08/13/2021   BUN 15 08/13/2021   CREATININE 1.00 08/13/2021   CALCIUM 9.2 08/13/2021   EGFR 75 08/13/2021   GFRNONAA >60 04/09/2020   Lab Results  Component Value Date   CHOL 209 (H) 08/13/2021   HDL 41 08/13/2021   LDLCALC 152 (H) 08/13/2021   TRIG 91 08/13/2021   CHOLHDL 4.0 08/09/2019   No results found for: "TSH" No results found for: "HGBA1C" Lab Results  Component Value Date   WBC 7.1 04/09/2020   HGB 16.8 04/09/2020   HCT 48.3 04/09/2020   MCV 90.6 04/09/2020   PLT 222 04/09/2020   Lab Results  Component Value Date   ALT 22 04/09/2020   AST 22 04/09/2020   ALKPHOS 75 04/09/2020   BILITOT 1.3 (H) 04/09/2020   No results found for: "25OHVITD2", "25OHVITD3", "VD25OH"   Review of Systems  Constitutional:  Negative for chills and fever.  HENT:  Positive for congestion, sinus pressure and sneezing. Negative for drooling,  ear discharge, ear pain and sore throat.   Respiratory:  Positive for cough. Negative for shortness of breath and wheezing.   Cardiovascular:  Negative for chest pain, palpitations and leg swelling.  Gastrointestinal:  Negative for abdominal pain, blood in stool, constipation, diarrhea and nausea.  Endocrine: Negative for polydipsia.  Genitourinary:  Negative for dysuria, frequency, hematuria and urgency.  Musculoskeletal:  Negative for back pain, myalgias and neck pain.  Skin:  Negative for rash.  Allergic/Immunologic: Negative for environmental allergies.  Neurological:  Positive for headaches. Negative for dizziness.  Hematological:  Does not bruise/bleed easily.  Psychiatric/Behavioral:  Negative for suicidal ideas. The patient is not nervous/anxious.     Patient Active Problem List   Diagnosis Date Noted   Spinal stenosis of lumbar region 04/11/2020   Body mass index (BMI) 28.0-28.9, adult 03/20/2020   Elevated blood-pressure reading, without diagnosis of hypertension 03/20/2020   Neurogenic claudication 02/02/2020   Rotator cuff syndrome of left shoulder 06/30/2018   Primary osteoarthritis of first carpometacarpal joint of right hand 09/08/2017   Hand pain, right 09/08/2017   Pure hypercholesterolemia 07/30/2017   OA (osteoarthritis) of knee 10/05/2013   Personal history of other malignant neoplasm of skin 02/28/2013   Barrett's esophagus 03/17/2012    No Known Allergies  Past Surgical History:  Procedure Laterality Date   CARPAL TUNNEL RELEASE Bilateral    CATARACT EXTRACTION W/PHACO Right 10/19/2019   Procedure: CATARACT EXTRACTION  PHACO AND INTRAOCULAR LENS PLACEMENT ([facility]) RIGHT 4.73 00:59.9 7.9%;  Surgeon: Leandrew Koyanagi, MD;  Location: [facility];  Service: Ophthalmology;  Laterality: Right;   CATARACT EXTRACTION W/PHACO Left 12/28/2019   Procedure: CATARACT EXTRACTION PHACO AND INTRAOCULAR LENS PLACEMENT ([place]) LEFT TORIC LENS;  Surgeon: Leandrew Koyanagi, MD;  Location: [facility];  Service: Ophthalmology;  Laterality: Left;  5.56 1:18.7 7.0%   COLONOSCOPY  06/10/2011   Dr Candace Cruise- normal   EYE SURGERY     JOINT REPLACEMENT     LUMBAR LAMINECTOMY/DECOMPRESSION MICRODISCECTOMY N/A 04/11/2020   Procedure: Laminectomy and Foraminotomy - Lumbar two-Lumbar three, Lumbar three-Lumbar four, Lumbar four-Lumbar five;  Surgeon: Kary Kos, MD;  Location: [facility];  Service: Neurosurgery;  Laterality: N/A;   PROSTATE SURGERY     removal of prostate due to cancer   REPLACEMENT TOTAL KNEE BILATERAL     UPPER GI ENDOSCOPY      Social History   Tobacco Use   Smoking status: Former    Packs/day: 0.50    Years: 11.00    Total pack years: 5.50    Types: Cigarettes    Quit date: 1980    Years since quitting: 43.4   Smokeless tobacco: Never   Tobacco comments:    smoking cessation materials not required  Vaping Use   Vaping Use: Never used  Substance Use Topics   Alcohol use: Yes    Alcohol/week: 3.0 standard drinks of alcohol    Types: 3 Shots of liquor per week    Comment: usually one drink every evening   Drug use: No     Medication list has been reviewed and updated.  Current Meds  Medication Sig   Acetaminophen (TYLENOL EXTRA STRENGTH PO) Take by mouth as needed.   aspirin EC 81 MG tablet Take 81 mg by mouth daily.   ibuprofen (ADVIL) 200 MG tablet Take 400 mg by mouth every 6 (six) hours as needed for headache or moderate pain.    Multiple Vitamins-Minerals (ONE-A-DAY MENS 50+ ADVANTAGE PO) Take 1 tablet by mouth daily.   niacin 500 MG tablet Take 500 mg by mouth daily.    Omega-3 Fatty Acids (FISH OIL) 500 MG CAPS Take 1,000 mg by mouth daily.   timolol (TIMOPTIC) 0.5 % ophthalmic solution Place 1 drop into both eyes at bedtime.        11/18/2021   11:24 AM 05/06/2021   10:39 AM 10/22/2020    1:29 PM 08/10/2020    9:36 AM  GAD 7 : Generalized Anxiety Score  Nervous, Anxious, on Edge 0 0 0 0  Control/stop worrying 0  0 0 1  Worry too much - different things 0 0 0 1  Trouble relaxing 0 0 0 0  Restless 0 0 0 0  Easily annoyed or irritable 0 0 0 1  Afraid - awful might happen 0 0 0 0  Total GAD 7 Score 0 0 0 3  Anxiety Difficulty Not difficult at all          11/18/2021   11:24 AM  Depression screen PHQ 2/9  Decreased Interest 0  Down, Depressed, Hopeless 0  PHQ - 2 Score 0  Altered sleeping 0  Tired, decreased energy 0  Change in appetite 0  Feeling bad or failure about yourself  0  Trouble concentrating 0  Moving slowly or fidgety/restless 0  Suicidal thoughts 0  PHQ-9 Score 0  Difficult doing work/chores Not difficult at all    BP Readings  from Last 3 Encounters:  11/18/21 130/70  08/13/21 130/62  05/31/21 138/80    Physical Exam Vitals and nursing note reviewed.  HENT:     Head: Normocephalic.     Right Ear: Tympanic membrane and external ear normal.     Left Ear: Tympanic membrane and external ear normal.     Nose: Congestion and rhinorrhea present.     Right Turbinates: Swollen.     Left Turbinates: Swollen.     Right Sinus: Maxillary sinus tenderness present. No frontal sinus tenderness.     Left Sinus: Maxillary sinus tenderness present. No frontal sinus tenderness.  Eyes:     General: No scleral icterus.       Right eye: No discharge.        Left eye: No discharge.     Conjunctiva/sclera: Conjunctivae normal.     Pupils: Pupils are equal, round, and reactive to light.  Neck:     Thyroid: No thyromegaly.     Vascular: No JVD.     Trachea: No tracheal deviation.  Cardiovascular:     Rate and Rhythm: Normal rate and regular rhythm.     Heart sounds: Normal heart sounds. No murmur heard.    No friction rub. No gallop.  Pulmonary:     Effort: No respiratory distress.     Breath sounds: Normal breath sounds. No wheezing or rales.  Abdominal:     General: Bowel sounds are normal.     Palpations: Abdomen is soft. There is no mass.     Tenderness: There is no abdominal  tenderness. There is no guarding or rebound.  Musculoskeletal:        General: No tenderness. Normal range of motion.     Cervical back: Normal range of motion and neck supple.  Lymphadenopathy:     Cervical: No cervical adenopathy.  Skin:    General: Skin is warm.     Findings: No rash.  Neurological:     Mental Status: He is alert and oriented to person, place, and time.     Cranial Nerves: No cranial nerve deficit.     Deep Tendon Reflexes: Reflexes are normal and symmetric.     Wt Readings from Last 3 Encounters:  11/18/21 178 lb (80.7 kg)  08/13/21 177 lb (80.3 kg)  05/31/21 183 lb (83 kg)    BP 130/70   Pulse 80   Ht $R'5\' 6"'Jz$  (1.676 m)   Wt 178 lb (80.7 kg)   BMI 28.73 kg/m   Assessment and Plan:  1. Acute maxillary sinusitis, recurrence not specified New onset.  Persistent.  About 10 days ago began upper respiratory symptoms including congestion, rhinorrhea, and productive nasal discharge.  Tenderness is noted over the maxillary sinuses bilateral which is consistent with an acute maxillary sinusitis and will treat with Augmentin 875 twice a day for 10 days. - amoxicillin-clavulanate (AUGMENTIN) 875-125 MG tablet; Take 1 tablet by mouth 2 (two) times daily.  Dispense: 20 tablet; Refill: 0  2. Acute cough New onset.  Persistent.  Patient is using an over-the-counter during the day and we will prescribe Promethazine DM to be used at night. - promethazine-dextromethorphan (PROMETHAZINE-DM) 6.25-15 MG/5ML syrup; Take 5 mLs by mouth 4 (four) times daily as needed for cough.  Dispense: 118 mL; Refill: 0

## 2021-11-28 DIAGNOSIS — Z6828 Body mass index (BMI) 28.0-28.9, adult: Secondary | ICD-10-CM | POA: Diagnosis not present

## 2021-11-28 DIAGNOSIS — M542 Cervicalgia: Secondary | ICD-10-CM | POA: Diagnosis not present

## 2021-11-29 ENCOUNTER — Other Ambulatory Visit: Payer: Self-pay | Admitting: Neurosurgery

## 2021-11-29 DIAGNOSIS — M542 Cervicalgia: Secondary | ICD-10-CM

## 2021-12-13 ENCOUNTER — Ambulatory Visit
Admission: RE | Admit: 2021-12-13 | Discharge: 2021-12-13 | Disposition: A | Discharge status: Home / Self Care | Payer: Medicare PPO | Source: Ambulatory Visit | Attending: Neurosurgery | Admitting: Neurosurgery

## 2021-12-13 DIAGNOSIS — M542 Cervicalgia: Secondary | ICD-10-CM

## 2021-12-13 DIAGNOSIS — M4802 Spinal stenosis, cervical region: Secondary | ICD-10-CM | POA: Diagnosis not present

## 2022-01-02 DIAGNOSIS — M542 Cervicalgia: Secondary | ICD-10-CM | POA: Diagnosis not present

## 2022-02-05 DIAGNOSIS — M47812 Spondylosis without myelopathy or radiculopathy, cervical region: Secondary | ICD-10-CM | POA: Diagnosis not present

## 2022-02-05 DIAGNOSIS — M542 Cervicalgia: Secondary | ICD-10-CM | POA: Diagnosis not present

## 2022-02-11 DIAGNOSIS — H40053 Ocular hypertension, bilateral: Secondary | ICD-10-CM | POA: Diagnosis not present

## 2022-02-12 DIAGNOSIS — M542 Cervicalgia: Secondary | ICD-10-CM | POA: Diagnosis not present

## 2022-02-12 DIAGNOSIS — M47812 Spondylosis without myelopathy or radiculopathy, cervical region: Secondary | ICD-10-CM | POA: Diagnosis not present

## 2022-02-14 DIAGNOSIS — M542 Cervicalgia: Secondary | ICD-10-CM | POA: Diagnosis not present

## 2022-02-14 DIAGNOSIS — M47812 Spondylosis without myelopathy or radiculopathy, cervical region: Secondary | ICD-10-CM | POA: Diagnosis not present

## 2022-02-18 DIAGNOSIS — M542 Cervicalgia: Secondary | ICD-10-CM | POA: Diagnosis not present

## 2022-02-18 DIAGNOSIS — M47812 Spondylosis without myelopathy or radiculopathy, cervical region: Secondary | ICD-10-CM | POA: Diagnosis not present

## 2022-02-20 DIAGNOSIS — M542 Cervicalgia: Secondary | ICD-10-CM | POA: Diagnosis not present

## 2022-02-20 DIAGNOSIS — M47812 Spondylosis without myelopathy or radiculopathy, cervical region: Secondary | ICD-10-CM | POA: Diagnosis not present

## 2022-02-25 DIAGNOSIS — M47812 Spondylosis without myelopathy or radiculopathy, cervical region: Secondary | ICD-10-CM | POA: Diagnosis not present

## 2022-02-25 DIAGNOSIS — M542 Cervicalgia: Secondary | ICD-10-CM | POA: Diagnosis not present

## 2022-02-26 ENCOUNTER — Telehealth: Payer: Self-pay

## 2022-02-26 DIAGNOSIS — M79601 Pain in right arm: Secondary | ICD-10-CM | POA: Diagnosis not present

## 2022-02-26 DIAGNOSIS — I440 Atrioventricular block, first degree: Secondary | ICD-10-CM | POA: Diagnosis not present

## 2022-02-26 DIAGNOSIS — M1991 Primary osteoarthritis, unspecified site: Secondary | ICD-10-CM | POA: Diagnosis not present

## 2022-02-26 DIAGNOSIS — R079 Chest pain, unspecified: Secondary | ICD-10-CM | POA: Diagnosis not present

## 2022-02-26 DIAGNOSIS — R10816 Epigastric abdominal tenderness: Secondary | ICD-10-CM | POA: Diagnosis not present

## 2022-02-26 DIAGNOSIS — R1013 Epigastric pain: Secondary | ICD-10-CM | POA: Diagnosis not present

## 2022-02-26 DIAGNOSIS — M79602 Pain in left arm: Secondary | ICD-10-CM | POA: Diagnosis not present

## 2022-02-26 DIAGNOSIS — R001 Bradycardia, unspecified: Secondary | ICD-10-CM | POA: Diagnosis not present

## 2022-02-26 DIAGNOSIS — R0602 Shortness of breath: Secondary | ICD-10-CM | POA: Diagnosis not present

## 2022-02-26 DIAGNOSIS — R0789 Other chest pain: Secondary | ICD-10-CM | POA: Diagnosis not present

## 2022-02-26 DIAGNOSIS — Z8546 Personal history of malignant neoplasm of prostate: Secondary | ICD-10-CM | POA: Diagnosis not present

## 2022-02-26 NOTE — Telephone Encounter (Signed)
Called pt and left a message for him to go to hospital after speaking to duaghter, due to chest pain. We do not have an EKG on file, but even so I explained that the EKG can only tell us what is going on now. Further eval. In a hospital setting is best.

## 2022-02-27 DIAGNOSIS — R079 Chest pain, unspecified: Secondary | ICD-10-CM | POA: Diagnosis not present

## 2022-02-27 DIAGNOSIS — K22719 Barrett's esophagus with dysplasia, unspecified: Secondary | ICD-10-CM | POA: Diagnosis not present

## 2022-03-04 DIAGNOSIS — M47812 Spondylosis without myelopathy or radiculopathy, cervical region: Secondary | ICD-10-CM | POA: Diagnosis not present

## 2022-03-04 DIAGNOSIS — M542 Cervicalgia: Secondary | ICD-10-CM | POA: Diagnosis not present

## 2022-03-12 ENCOUNTER — Encounter: Payer: Self-pay | Admitting: Family Medicine

## 2022-03-12 ENCOUNTER — Ambulatory Visit: Payer: Self-pay | Admitting: *Deleted

## 2022-03-12 ENCOUNTER — Telehealth (INDEPENDENT_AMBULATORY_CARE_PROVIDER_SITE_OTHER): Payer: Medicare PPO | Admitting: Family Medicine

## 2022-03-12 DIAGNOSIS — J1282 Pneumonia due to coronavirus disease 2019: Secondary | ICD-10-CM | POA: Insufficient documentation

## 2022-03-12 DIAGNOSIS — U071 COVID-19: Secondary | ICD-10-CM | POA: Diagnosis not present

## 2022-03-12 MED ORDER — MOLNUPIRAVIR EUA 200MG CAPSULE: 4.0000 | ORAL_CAPSULE | Freq: Two times a day (BID) | ORAL | 0 refills | Status: AC

## 2022-03-12 MED ORDER — PROMETHAZINE-DM 6.25-15 MG/5ML PO SYRP: 2.5000 mL | ORAL_SOLUTION | Freq: Four times a day (QID) | ORAL | 0 refills | Status: DC | PRN

## 2022-03-12 NOTE — Progress Notes (Addendum)
Primary Care / Sports Medicine Virtual Visit  Patient Information:  Patient ID: Kevin Oneal, male DOB: [birth year] Age: 81 y.o. MRN: [id number]   Kevin Oneal is a pleasant 81 y.o. male presenting with the following:  Chief Complaint  Patient presents with   Covid Positive    Coughing, stuffy head, Tested positive this morning.     Review of Systems: No fevers, chills, night sweats, weight loss, chest pain, or shortness of breath.   Patient Active Problem List   Diagnosis Date Noted   COVID 03/12/2022   Spinal stenosis of lumbar region 04/11/2020   Body mass index (BMI) 28.0-28.9, adult 03/20/2020   Elevated blood-pressure reading, without diagnosis of hypertension 03/20/2020   Neurogenic claudication 02/02/2020   Rotator cuff syndrome of left shoulder 06/30/2018   Primary osteoarthritis of first carpometacarpal joint of right hand 09/08/2017   Hand pain, right 09/08/2017   Pure hypercholesterolemia 07/30/2017   OA (osteoarthritis) of knee 10/05/2013   Personal history of other malignant neoplasm of skin 02/28/2013   Barrett's esophagus 03/17/2012   Past Medical History:  Diagnosis Date   Arthritis    hands esp right   Back pain    nerves in right leg, right thigh to foot feels numb sometimes   Cancer (HCC)    prostate   GERD (gastroesophageal reflux disease)    History of shingles    HOH (hard of hearing)    aids   Motion sickness    inner ear issues   Seasonal allergies    Outpatient Encounter Medications as of 03/12/2022  Medication Sig   molnupiravir EUA (LAGEVRIO) 200 mg CAPS capsule Take 4 capsules (800 mg total) by mouth 2 (two) times daily for 5 days.   promethazine-dextromethorphan (PROMETHAZINE-DM) 6.25-15 MG/5ML syrup Take 2.5 mLs by mouth 4 (four) times daily as needed for cough.   Acetaminophen (TYLENOL EXTRA STRENGTH PO) Take by mouth as needed.   aspirin EC 81 MG tablet Take 81 mg by mouth daily.   atorvastatin (LIPITOR) 20 MG tablet Take  20 mg by mouth daily.   ibuprofen (ADVIL) 200 MG tablet Take 400 mg by mouth every 6 (six) hours as needed for headache or moderate pain.    Multiple Vitamins-Minerals (ONE-A-DAY MENS 50+ ADVANTAGE PO) Take 1 tablet by mouth daily.   niacin 500 MG tablet Take 500 mg by mouth daily.    Omega-3 Fatty Acids (FISH OIL) 500 MG CAPS Take 1,000 mg by mouth daily.   timolol (TIMOPTIC) 0.5 % ophthalmic solution Place 1 drop into both eyes at bedtime.    [DISCONTINUED] amoxicillin-clavulanate (AUGMENTIN) 875-125 MG tablet Take 1 tablet by mouth 2 (two) times daily.   [DISCONTINUED] promethazine-dextromethorphan (PROMETHAZINE-DM) 6.25-15 MG/5ML syrup Take 5 mLs by mouth 4 (four) times daily as needed for cough.   [DISCONTINUED] valACYclovir (VALTREX) 1000 MG tablet Take 1 tablet (1,000 mg total) by mouth 3 (three) times daily. (Patient not taking: Reported on 08/13/2021)   No facility-administered encounter medications on file as of 03/12/2022.   Past Surgical History:  Procedure Laterality Date   CARPAL TUNNEL RELEASE Bilateral    CATARACT EXTRACTION W/PHACO Right 10/19/2019   Procedure: CATARACT EXTRACTION PHACO AND INTRAOCULAR LENS PLACEMENT ([facility]) RIGHT 4.73 00:59.9 7.9%;  Surgeon: Leandrew Koyanagi, MD;  Location: [facility];  Service: Ophthalmology;  Laterality: Right;   CATARACT EXTRACTION W/PHACO Left 12/28/2019   Procedure: CATARACT EXTRACTION PHACO AND INTRAOCULAR LENS PLACEMENT ([place]) LEFT TORIC LENS;  Surgeon: Leandrew Koyanagi, MD;  Location: [facility];  Service: Ophthalmology;  Laterality: Left;  5.56 1:18.7 7.0%   COLONOSCOPY  06/10/2011   Dr Candace Cruise- normal   EYE SURGERY     JOINT REPLACEMENT     LUMBAR LAMINECTOMY/DECOMPRESSION MICRODISCECTOMY N/A 04/11/2020   Procedure: Laminectomy and Foraminotomy - Lumbar two-Lumbar three, Lumbar three-Lumbar four, Lumbar four-Lumbar five;  Surgeon: Kary Kos, MD;  Location: [facility];  Service: Neurosurgery;  Laterality: N/A;   PROSTATE  SURGERY     removal of prostate due to cancer   REPLACEMENT TOTAL KNEE BILATERAL     UPPER GI ENDOSCOPY      Virtual Visit via MyChart Video:   I connected with Cleotis Sparr Beidleman on 03/12/22 via Telephone and verified that I am speaking with the correct person using appropriate identifiers.   The limitations, risks, security and privacy concerns of performing an evaluation and management service by Telephone, including the higher likelihood of inaccurate diagnoses and treatments, and the availability of in person appointments were reviewed. The possible need of an additional face-to-face encounter for complete and high quality delivery of care was discussed. The patient was also made aware that there may be a patient responsible charge related to this service. The patient expressed understanding and wishes to proceed.  Provider location is in medical facility. Patient location is at their home, different from provider location. People involved in care of the patient during this telehealth encounter were myself, my nurse/medical assistant, and my front office/scheduling team member.  Objective findings:   General: Speaking full sentences, no audible heavy breathing. Sounds alert and appropriately interactive.  Independent interpretation of notes and tests performed by another provider:   None  Pertinent History, Exam, Impression, and Recommendations:   COVID Day 2 of symptoms, primarily involving facial/sinus pressure, cough, postnasal drip, and mild myalgias.  He denies any fevers or chills, no shortness of air.  He did receive vaccine series, has been dosing Tylenol PM a few times since onset with subtle improvement.  We discussed various treatment strategies and given his relative comorbid risks, he will start molnupiravir course, additionally I have sent in Rx antitussive to be used as needed, as well as recommendations for Flonase and Mucinex with appropriate supportive care.   Isolation and masking specifics discussed, he is to contact us in a week if symptoms fail to improve at which point secondary/superimposed infections to be considered and addressed.  He can follow-up as needed.   Orders & Medications Meds ordered this encounter  Medications   molnupiravir EUA (LAGEVRIO) 200 mg CAPS capsule    Sig: Take 4 capsules (800 mg total) by mouth 2 (two) times daily for 5 days.    Dispense:  40 capsule    Refill:  0   promethazine-dextromethorphan (PROMETHAZINE-DM) 6.25-15 MG/5ML syrup    Sig: Take 2.5 mLs by mouth 4 (four) times daily as needed for cough.    Dispense:  118 mL    Refill:  0   No orders of the defined types were placed in this encounter.    I discussed the above assessment and treatment plan with the patient. The patient was provided an opportunity to ask questions and all were answered. The patient agreed with the plan and demonstrated an understanding of the instructions.   The patient was advised to call back or seek an in-person evaluation if the symptoms worsen or if the condition fails to improve as anticipated.   I provided a total time of 30 minutes on 03/12/2022  inclusive of time utilized for medical chart review, information gathering, care coordination with staff, and documentation completion.    Montel Culver, MD   Primary Care Sports Medicine [place]
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
## 2022-03-12 NOTE — Assessment & Plan Note (Signed)
Day 2 of symptoms, primarily involving facial/sinus pressure, cough, postnasal drip, and mild myalgias.  He denies any fevers or chills, no shortness of air.  He did receive vaccine series, has been dosing Tylenol PM a few times since onset with subtle improvement.  We discussed various treatment strategies and given his relative comorbid risks, he will start molnupiravir course, additionally I have sent in Rx antitussive to be used as needed, as well as recommendations for Flonase and Mucinex with appropriate supportive care.  Isolation and masking specifics discussed, he is to contact us in a week if symptoms fail to improve at which point secondary/superimposed infections to be considered and addressed.  He can follow-up as needed.

## 2022-04-03 ENCOUNTER — Encounter: Payer: Self-pay | Admitting: Dermatology

## 2022-04-03 ENCOUNTER — Ambulatory Visit: Payer: Medicare PPO | Admitting: Dermatology

## 2022-04-03 DIAGNOSIS — L820 Inflamed seborrheic keratosis: Secondary | ICD-10-CM | POA: Diagnosis not present
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
## 2022-04-03 NOTE — Patient Instructions (Addendum)
Wound Care Instructions  Cleanse wound gently with soap and water once a day then pat dry with clean gauze. Apply a thin coat of Petrolatum (petroleum jelly, "Vaseline") over the wound (unless you have an allergy to this). We recommend that you use a new, sterile tube of Vaseline. Do not pick or remove scabs. Do not remove the yellow or white "healing tissue" from the base of the wound.  Cover the wound with fresh, clean, nonstick gauze and secure with paper tape. You may use Band-Aids in place of gauze and tape if the wound is small enough, but would recommend trimming much of the tape off as there is often too much. Sometimes Band-Aids can irritate the skin.  You should call the office for your biopsy report after 1 week if you have not already been contacted.  If you experience any problems, such as abnormal amounts of bleeding, swelling, significant bruising, significant pain, or evidence of infection, please call the office immediately.  FOR ADULT SURGERY PATIENTS: If you need something for pain relief you may take 1 extra strength Tylenol (acetaminophen) AND 2 Ibuprofen (200mg each) together every 4 hours as needed for pain. (do not take these if you are allergic to them or if you have a reason you should not take them.) Typically, you may only need pain medication for 1 to 3 days.     Due to recent changes in healthcare laws, you may see results of your pathology and/or laboratory studies on MyChart before the doctors have had a chance to review them. We understand that in some cases there may be results that are confusing or concerning to you. Please understand that not all results are received at the same time and often the doctors may need to interpret multiple results in order to provide you with the best plan of care or course of treatment. Therefore, we ask that you please give us 2 business days to thoroughly review all your results before contacting the office for clarification. Should  we see a critical lab result, you will be contacted sooner.   If You Need Anything After Your Visit  If you have any questions or concerns for your doctor, please call our main line at [phone] and press option 4 to reach your doctor's medical assistant. If no one answers, please leave a voicemail as directed and we will return your call as soon as possible. Messages left after 4 pm will be answered the following business day.   You may also send us a message via MyChart. We typically respond to MyChart messages within 1-2 business days.  For prescription refills, please ask your pharmacy to contact our office. Our fax number is [phone].  If you have an urgent issue when the clinic is closed that cannot wait until the next business day, you can page your doctor at the number below.    Please note that while we do our best to be available for urgent issues outside of office hours, we are not available 24/7.   If you have an urgent issue and are unable to reach us, you may choose to seek medical care at your doctor's office, retail clinic, urgent care center, or emergency room.  If you have a medical emergency, please immediately call 911 or go to the emergency department.  Pager Numbers  - Dr. Kowalski: [phone]  - Dr. Moye: [phone]  - Dr. Stewart: [phone]  In the event of inclement weather, please call our main line at   [phone] for an update on the status of any delays or closures.  Dermatology Medication Tips: Please keep the boxes that topical medications come in in order to help keep track of the instructions about where and how to use these. Pharmacies typically print the medication instructions only on the boxes and not directly on the medication tubes.   If your medication is too expensive, please contact our office at [phone] option 4 or send us a message through MyChart.   We are unable to tell what your co-pay for medications will be in  advance as this is different depending on your insurance coverage. However, we may be able to find a substitute medication at lower cost or fill out paperwork to get insurance to cover a needed medication.   If a prior authorization is required to get your medication covered by your insurance company, please allow us 1-2 business days to complete this process.  Drug prices often vary depending on where the prescription is filled and some pharmacies may offer cheaper prices.  The website www.goodrx.com contains coupons for medications through different pharmacies. The prices here do not account for what the cost may be with help from insurance (it may be cheaper with your insurance), but the website can give you the price if you did not use any insurance.  - You can print the associated coupon and take it with your prescription to the pharmacy.  - You may also stop by our office during regular business hours and pick up a GoodRx coupon card.  - If you need your prescription sent electronically to a different pharmacy, notify our office through [facility] MyChart or by phone at [phone] option 4.     Si Usted Necesita Algo Despus de Su Visita  Tambin puede enviarnos un mensaje a travs de MyChart. Por lo general respondemos a los mensajes de MyChart en el transcurso de 1 a 2 das hbiles.  Para renovar recetas, por favor pida a su farmacia que se ponga en contacto con nuestra oficina. Nuestro nmero de fax es el [phone].  Si tiene un asunto urgente cuando la clnica est cerrada y que no puede esperar hasta el siguiente da hbil, puede llamar/localizar a su doctor(a) al nmero que aparece a continuacin.   Por favor, tenga en cuenta que aunque hacemos todo lo posible para estar disponibles para asuntos urgentes fuera del horario de oficina, no estamos disponibles las 24 horas del da, los 7 das de la semana.   Si tiene un problema urgente y no puede comunicarse con nosotros, puede  optar por buscar atencin mdica  en el consultorio de su doctor(a), en una clnica privada, en un centro de atencin urgente o en una sala de emergencias.  Si tiene una emergencia mdica, por favor llame inmediatamente al 911 o vaya a la sala de emergencias.  Nmeros de bper  - Dr. Kowalski: [phone]  - Dra. Moye: [phone]  - Dra. Stewart: [phone]  En caso de inclemencias del tiempo, por favor llame a nuestra lnea principal al [phone] para una actualizacin sobre el estado de cualquier retraso o cierre.  Consejos para la medicacin en dermatologa: Por favor, guarde las cajas en las que vienen los medicamentos de uso tpico para ayudarle a seguir las instrucciones sobre dnde y cmo usarlos. Las farmacias generalmente imprimen las instrucciones del medicamento slo en las cajas y no directamente en los tubos del medicamento.   Si su medicamento es muy caro, por favor, pngase en contacto con   nuestra oficina llamando al [phone] y presione la opcin 4 o envenos un mensaje a travs de MyChart.   No podemos decirle cul ser su copago por los medicamentos por adelantado ya que esto es diferente dependiendo de la cobertura de su seguro. Sin embargo, es posible que podamos encontrar un medicamento sustituto a menor costo o llenar un formulario para que el seguro cubra el medicamento que se considera necesario.   Si se requiere una autorizacin previa para que su compaa de seguros cubra su medicamento, por favor permtanos de 1 a 2 das hbiles para completar este proceso.  Los precios de los medicamentos varan con frecuencia dependiendo del lugar de dnde se surte la receta y alguna farmacias pueden ofrecer precios ms baratos.  El sitio web www.goodrx.com tiene cupones para medicamentos de diferentes farmacias. Los precios aqu no tienen en cuenta lo que podra costar con la ayuda del seguro (puede ser ms barato con su seguro), pero el sitio web puede darle el  precio si no utiliz ningn seguro.  - Puede imprimir el cupn correspondiente y llevarlo con su receta a la farmacia.  - Tambin puede pasar por nuestra oficina durante el horario de atencin regular y recoger una tarjeta de cupones de GoodRx.  - Si necesita que su receta se enve electrnicamente a una farmacia diferente, informe a nuestra oficina a travs de MyChart de [facility] o por telfono llamando al [phone] y presione la opcin 4.  

## 2022-04-07 ENCOUNTER — Encounter: Payer: Self-pay | Admitting: Dermatology

## 2022-04-08 DIAGNOSIS — M542 Cervicalgia: Secondary | ICD-10-CM | POA: Diagnosis not present

## 2022-04-08 DIAGNOSIS — Z6828 Body mass index (BMI) 28.0-28.9, adult: Secondary | ICD-10-CM | POA: Diagnosis not present

## 2022-05-07 DIAGNOSIS — E7800 Pure hypercholesterolemia, unspecified: Secondary | ICD-10-CM | POA: Diagnosis not present

## 2022-05-07 DIAGNOSIS — R030 Elevated blood-pressure reading, without diagnosis of hypertension: Secondary | ICD-10-CM | POA: Diagnosis not present

## 2022-05-07 DIAGNOSIS — K22719 Barrett's esophagus with dysplasia, unspecified: Secondary | ICD-10-CM | POA: Diagnosis not present

## 2022-05-19 ENCOUNTER — Ambulatory Visit: Payer: Medicare PPO | Admitting: Family Medicine

## 2022-05-19 DIAGNOSIS — Z79899 Other long term (current) drug therapy: Secondary | ICD-10-CM | POA: Diagnosis not present

## 2022-05-19 DIAGNOSIS — S39013A Strain of muscle, fascia and tendon of pelvis, initial encounter: Secondary | ICD-10-CM | POA: Diagnosis not present

## 2022-05-19 DIAGNOSIS — M25552 Pain in left hip: Secondary | ICD-10-CM | POA: Diagnosis not present

## 2022-05-19 DIAGNOSIS — S76012A Strain of muscle, fascia and tendon of left hip, initial encounter: Secondary | ICD-10-CM | POA: Diagnosis not present

## 2022-05-19 DIAGNOSIS — E7800 Pure hypercholesterolemia, unspecified: Secondary | ICD-10-CM | POA: Diagnosis not present

## 2022-05-21 ENCOUNTER — Ambulatory Visit: Payer: Self-pay | Admitting: *Deleted

## 2022-05-21 ENCOUNTER — Telehealth: Payer: Self-pay

## 2022-05-21 ENCOUNTER — Telehealth: Payer: Self-pay | Admitting: Family Medicine

## 2022-05-21 NOTE — Telephone Encounter (Signed)
Copied from [place] [phone]. Topic: General - Other >> May 21, 2022  8:13 AM Chapman Fitch wrote: Reason for CRM: pt called and stated he needs to talk to Dr. Ronnald Ramp or Baxter Flattery about his left hip pain and who he needs to see / please advise asap

## 2022-05-21 NOTE — Telephone Encounter (Signed)
Transition Care Management Follow-up Telephone Call Date of discharge and from where: 05/19/2022/ [facility] How have you been since you were released from the hospital? Pain med is keeping it from hurting Any questions or concerns? No  Items Reviewed: Did the pt receive and understand the discharge instructions provided? Yes  Medications obtained and verified? Yes  Other? No  Any new allergies since your discharge? No  Dietary orders reviewed? Yes Do you have support at home? Yes   Home Care and Equipment/Supplies: none  Functional Questionnaire: (I = Independent and D = Dependent) ADLs: I  Bathing/Dressing- I  Meal Prep- I  Eating- I  Maintaining continence- I  Transferring/Ambulation- I  Managing Meds- I  Follow up appointments reviewed:  PCP Hospital f/u appt confirmed? Yes  Scheduled to see Dr Otilio Miu on 05/22/2022 @ 11:00. [facility] f/u appt confirmed?  Will determine at visit Are transportation arrangements needed? No  If their condition worsens, is the pt aware to call PCP or go to the Emergency Dept.? Yes Was the patient provided with contact information for the PCP's office or ED? Yes Was to pt encouraged to call back with questions or concerns? Yes

## 2022-05-21 NOTE — Telephone Encounter (Signed)
  Chief Complaint: Left hip pain.  Seen ED told had a torn muscle.   Wants a referral to a specialist or an MRI done.  Pt requesting to be seen ASAP. Symptoms: Left hip pain Frequency: Constant   Can't lay on his back.   Only able to sleep on right side Pertinent Negatives: Patient denies N/A Disposition: '[]'$ ED /'[]'$ Urgent Care (no appt availability in office) / '[x]'$ Appointment(In office/virtual)/ '[]'$  [facility]/ '[]'$ Home Care/ '[]'$ Refused Recommended Disposition /'[]'$ [facility]/ '[]'$  Follow-up with PCP Additional Notes: Appt. Made with Dr. Otilio Miu for 05/22/2022 at 11:00.

## 2022-05-21 NOTE — Telephone Encounter (Signed)
Message from Sharene Skeans sent at 05/21/2022  8:17 AM EST  Summary: hip pain   Pt stated he went to [facility] and they couldn't do anything for him / he asked to speak with Dr. Ronnald Ramp or Baxter Flattery to see what he needs to do about his hip pain / stated he needs help with the pain asap/ please advise          Call History   Type Contact Phone/Fax User  05/21/2022 08:14 AM EST Phone (Incoming) Oneal, Kevin Vences (Self) [phone] [address] [address]   Reason for Disposition  [1] MODERATE pain (e.g., interferes with normal activities, limping) AND [2] present > 3 days  Answer Assessment - Initial Assessment Questions 1. LOCATION and RADIATION: "Where is the pain located?"      I want to explain to Dr. Ronnald Ramp where the pain is in my left hip.  I went to the ED on 05/19/2022.   It's a ligament or muscle that is torn is what I was told.   X rays did not show anything. Who can I go to who can do an MRI.    2. QUALITY: "What does the pain feel like?"  (e.g., sharp, dull, aching, burning)     I can't sleep or lay on my back.  I'm having bad pain in my hip.    I need something done. 3. SEVERITY: "How bad is the pain?" "What does it keep you from doing?"   (Scale 1-10; or mild, moderate, severe)   -  MILD (1-3): doesn't interfere with normal activities    -  MODERATE (4-7): interferes with normal activities (e.g., work or school) or awakens from sleep, limping    -  SEVERE (8-10): excruciating pain, unable to do any normal activities, unable to walk     Moderate I need medical advice. 4. ONSET: "When did the pain start?" "Does it come and go, or is it there all the time?"     I was putting up a Christmas tree and I heard something in my body pop   Later that day the left hip started hurting. 5. WORK OR EXERCISE: "Has there been any recent work or exercise that involved this part of the body?"      See above 6. CAUSE: "What do you think is causing the hip pain?"      Something popped.   Dr.  Michela Pitcher it was a torn muscle 7. AGGRAVATING FACTORS: "What makes the hip pain worse?" (e.g., walking, climbing stairs, running)     Laying on back 8. OTHER SYMPTOMS: "Do you have any other symptoms?" (e.g., back pain, pain shooting down leg,  fever, rash)     I've been limping for 3-4 days.   A little pain has gone down to my calf.   I have to lay on my right side.  Protocols used: Hip Pain-A-AH

## 2022-05-22 ENCOUNTER — Ambulatory Visit: Payer: Medicare PPO | Admitting: Family Medicine

## 2022-05-22 VITALS — BP 136/84 | HR 82 | Ht 68.0 in | Wt 176.0 lb

## 2022-05-22 DIAGNOSIS — S76392D Other specified injury of muscle, fascia and tendon of the posterior muscle group at thigh level, left thigh, subsequent encounter: Secondary | ICD-10-CM

## 2022-05-22 MED ORDER — IBUPROFEN 600 MG PO TABS: 600.0000 mg | ORAL_TABLET | Freq: Two times a day (BID) | ORAL | 1 refills | Status: DC

## 2022-05-22 NOTE — Progress Notes (Signed)
Date:  05/22/2022   Name:  Kevin Oneal   DOB:  17-Jan-1941   MRN:  [id number]   Chief Complaint: Hospitalization Follow-up (Went to ER on 05/19/22 with L) hip pain. TOC call placed on 05/21/22- pain is radiating down to lower leg when walking. L) hip hurts especially at night when trying to sleep.)  Hip Pain  The incident occurred 5 to 7 days ago (Saturday). Injury mechanism: on hands and knees getting off floor. The pain is present in the left hip and left thigh. The quality of the pain is described as aching. The pain is at a severity of 2/10 (8/10 lying down/walking). The pain is moderate. The pain has been Constant since onset. Associated symptoms include an inability to bear weight and a loss of motion. Pertinent negatives include no muscle weakness or numbness. The symptoms are aggravated by weight bearing.    Lab Results  Component Value Date   NA 138 08/13/2021   K 4.3 08/13/2021   CO2 22 08/13/2021   GLUCOSE 90 08/13/2021   BUN 15 08/13/2021   CREATININE 1.00 08/13/2021   CALCIUM 9.2 08/13/2021   EGFR 75 08/13/2021   GFRNONAA >60 04/09/2020   Lab Results  Component Value Date   CHOL 209 (H) 08/13/2021   HDL 41 08/13/2021   LDLCALC 152 (H) 08/13/2021   TRIG 91 08/13/2021   CHOLHDL 4.0 08/09/2019   No results found for: "TSH" No results found for: "HGBA1C" Lab Results  Component Value Date   WBC 7.1 04/09/2020   HGB 16.8 04/09/2020   HCT 48.3 04/09/2020   MCV 90.6 04/09/2020   PLT 222 04/09/2020   Lab Results  Component Value Date   ALT 22 04/09/2020   AST 22 04/09/2020   ALKPHOS 75 04/09/2020   BILITOT 1.3 (H) 04/09/2020   No results found for: "25OHVITD2", "25OHVITD3", "VD25OH"   Review of Systems  Respiratory:  Negative for chest tightness, shortness of breath and wheezing.   Cardiovascular:  Negative for chest pain, palpitations and leg swelling.  Musculoskeletal:  Positive for myalgias.  Neurological:  Negative for numbness.    Patient  Active Problem List   Diagnosis Date Noted   COVID 03/12/2022   Spinal stenosis of lumbar region 04/11/2020   Body mass index (BMI) 28.0-28.9, adult 03/20/2020   Elevated blood-pressure reading, without diagnosis of hypertension 03/20/2020   Neurogenic claudication 02/02/2020   Rotator cuff syndrome of left shoulder 06/30/2018   Primary osteoarthritis of first carpometacarpal joint of right hand 09/08/2017   Hand pain, right 09/08/2017   Pure hypercholesterolemia 07/30/2017   OA (osteoarthritis) of knee 10/05/2013   Personal history of other malignant neoplasm of skin 02/28/2013   Barrett's esophagus 03/17/2012    No Known Allergies  Past Surgical History:  Procedure Laterality Date   CARPAL TUNNEL RELEASE Bilateral    CATARACT EXTRACTION W/PHACO Right 10/19/2019   Procedure: CATARACT EXTRACTION PHACO AND INTRAOCULAR LENS PLACEMENT ([facility]) RIGHT 4.73 00:59.9 7.9%;  Surgeon: Leandrew Koyanagi, MD;  Location: [facility];  Service: Ophthalmology;  Laterality: Right;   CATARACT EXTRACTION W/PHACO Left 12/28/2019   Procedure: CATARACT EXTRACTION PHACO AND INTRAOCULAR LENS PLACEMENT ([place]) LEFT TORIC LENS;  Surgeon: Leandrew Koyanagi, MD;  Location: [facility];  Service: Ophthalmology;  Laterality: Left;  5.56 1:18.7 7.0%   COLONOSCOPY  06/10/2011   Dr Candace Cruise- normal   EYE SURGERY     JOINT REPLACEMENT     LUMBAR LAMINECTOMY/DECOMPRESSION MICRODISCECTOMY N/A 04/11/2020   Procedure:  Laminectomy and Foraminotomy - Lumbar two-Lumbar three, Lumbar three-Lumbar four, Lumbar four-Lumbar five;  Surgeon: Kary Kos, MD;  Location: [facility];  Service: Neurosurgery;  Laterality: N/A;   PROSTATE SURGERY     removal of prostate due to cancer   REPLACEMENT TOTAL KNEE BILATERAL     UPPER GI ENDOSCOPY      Social History   Tobacco Use   Smoking status: Former    Packs/day: 0.50    Years: 11.00    Total pack years: 5.50    Types: Cigarettes    Quit date: 1980    Years since  quitting: 43.9   Smokeless tobacco: Never   Tobacco comments:    smoking cessation materials not required  Vaping Use   Vaping Use: Never used  Substance Use Topics   Alcohol use: Yes    Alcohol/week: 3.0 standard drinks of alcohol    Types: 3 Shots of liquor per week    Comment: usually one drink every evening   Drug use: No     Medication list has been reviewed and updated.  Current Meds  Medication Sig   Acetaminophen (TYLENOL EXTRA STRENGTH PO) Take by mouth as needed.   aspirin EC 81 MG tablet Take 81 mg by mouth daily.   atorvastatin (LIPITOR) 20 MG tablet Take 20 mg by mouth daily.   HYDROcodone-acetaminophen (NORCO/VICODIN) 5-325 MG tablet Take by mouth.   ibuprofen (ADVIL) 200 MG tablet Take 400 mg by mouth every 6 (six) hours as needed for headache or moderate pain.    Multiple Vitamins-Minerals (ONE-A-DAY MENS 50+ ADVANTAGE PO) Take 1 tablet by mouth daily.   Omega-3 Fatty Acids (FISH OIL) 500 MG CAPS Take 1,000 mg by mouth daily.   timolol (TIMOPTIC) 0.5 % ophthalmic solution Place 1 drop into both eyes at bedtime.    [DISCONTINUED] niacin 500 MG tablet Take 500 mg by mouth daily.    [DISCONTINUED] promethazine-dextromethorphan (PROMETHAZINE-DM) 6.25-15 MG/5ML syrup Take 2.5 mLs by mouth 4 (four) times daily as needed for cough.       05/22/2022   11:10 AM 11/18/2021   11:24 AM 05/06/2021   10:39 AM 10/22/2020    1:29 PM  GAD 7 : Generalized Anxiety Score  Nervous, Anxious, on Edge 0 0 0 0  Control/stop worrying 0 0 0 0  Worry too much - different things 0 0 0 0  Trouble relaxing 0 0 0 0  Restless 0 0 0 0  Easily annoyed or irritable 0 0 0 0  Afraid - awful might happen 0 0 0 0  Total GAD 7 Score 0 0 0 0  Anxiety Difficulty Not difficult at all Not difficult at all         05/22/2022   11:10 AM 11/18/2021   11:24 AM 05/06/2021   10:39 AM  Depression screen PHQ 2/9  Decreased Interest 0 0 0  Down, Depressed, Hopeless 0 0 0  PHQ - 2 Score 0 0 0   Altered sleeping 0 0 2  Tired, decreased energy 0 0 1  Change in appetite 0 0 0  Feeling bad or failure about yourself  0 0 0  Trouble concentrating 0 0 0  Moving slowly or fidgety/restless 0 0 0  Suicidal thoughts 0 0 0  PHQ-9 Score 0 0 3  Difficult doing work/chores Not difficult at all Not difficult at all Not difficult at all    BP Readings from Last 3 Encounters:  05/22/22 (!) 122/90  11/18/21 130/70  08/13/21 130/62    Physical Exam HENT:     Right Ear: Tympanic membrane and ear canal normal.     Left Ear: Tympanic membrane and ear canal normal.  Cardiovascular:     Heart sounds: No murmur heard.    No friction rub. No gallop.  Pulmonary:     Breath sounds: No wheezing, rhonchi or rales.  Abdominal:     Tenderness: There is no abdominal tenderness. There is no guarding.  Musculoskeletal:     Left upper leg: Tenderness present. No swelling, edema, deformity, lacerations or bony tenderness.  Neurological:     Mental Status: He is alert.     Wt Readings from Last 3 Encounters:  05/22/22 176 lb (79.8 kg)  11/18/21 178 lb (80.7 kg)  08/13/21 177 lb (80.3 kg)    BP (!) 122/90 (BP Location: Right Arm, Cuff Size: Large)   Pulse 82   Ht _0  (1.727 m)   Wt 176 lb (79.8 kg)   SpO2 96%   BMI 26.76 kg/m   Assessment and Plan:  1. Avulsion of hamstring muscle, left, subsequent encounter New onset.  Persistent.  Relatively stable given the initial presentation.  Patient had significant pain when he went from a crouched position to standing up and felt an abrupt pop in the left ischial area.  Patient had persistent pain Mar[place] to the point that he went to the emergency room for evaluation in [place].  X-ray did not reveal a fracture but there was some suggestion if any further pain that the MRI may need to be done.  I explained to the patient is a bit early to do that and the preference would be to have orthopedics evaluation to see if and when that might be necessary  mechanism of injury and the distinctive pop that the patient hurt/experience is suggesting that there may have been a tear of the hamstring and there is tenderness along the ischial process.  There is no palpable defect.  We will discontinue the 200 mg Motrin and go to 600 mg every 12 hours along with Tylenol.  Patient has 2 hydrocodone's left to take only at night we have acquired a orthopedic evaluation with [facility] tomorrow for evaluation if there is any further imaging that needs to be done or initiation of any further therapies such as physical therapy. - Ambulatory referral to Orthopedic Surgery - ibuprofen (ADVIL) 600 MG tablet; Take 1 tablet (600 mg total) by mouth every 12 (twelve) hours.  Dispense: 60 tablet; Refill: 1    Otilio Miu, MD

## 2022-05-23 ENCOUNTER — Other Ambulatory Visit: Payer: Self-pay | Admitting: Orthopedic Surgery

## 2022-05-23 ENCOUNTER — Ambulatory Visit
Admission: RE | Admit: 2022-05-23 | Discharge: 2022-05-23 | Disposition: A | Discharge status: Home / Self Care | Payer: Medicare PPO | Source: Ambulatory Visit | Attending: Orthopedic Surgery | Admitting: Orthopedic Surgery

## 2022-05-23 DIAGNOSIS — M5136 Other intervertebral disc degeneration, lumbar region: Secondary | ICD-10-CM | POA: Diagnosis not present

## 2022-05-23 DIAGNOSIS — M4807 Spinal stenosis, lumbosacral region: Secondary | ICD-10-CM | POA: Diagnosis not present

## 2022-05-23 DIAGNOSIS — M25552 Pain in left hip: Secondary | ICD-10-CM

## 2022-05-23 DIAGNOSIS — M5106 Intervertebral disc disorders with myelopathy, lumbar region: Secondary | ICD-10-CM | POA: Diagnosis not present

## 2022-05-23 DIAGNOSIS — M47816 Spondylosis without myelopathy or radiculopathy, lumbar region: Secondary | ICD-10-CM | POA: Diagnosis not present

## 2022-05-23 DIAGNOSIS — M4316 Spondylolisthesis, lumbar region: Secondary | ICD-10-CM | POA: Diagnosis not present

## 2022-05-23 DIAGNOSIS — M5416 Radiculopathy, lumbar region: Secondary | ICD-10-CM | POA: Diagnosis not present

## 2022-05-23 DIAGNOSIS — M545 Low back pain, unspecified: Secondary | ICD-10-CM | POA: Diagnosis not present

## 2022-06-12 DIAGNOSIS — M48062 Spinal stenosis, lumbar region with neurogenic claudication: Secondary | ICD-10-CM | POA: Diagnosis not present

## 2022-06-18 DIAGNOSIS — H903 Sensorineural hearing loss, bilateral: Secondary | ICD-10-CM | POA: Diagnosis not present

## 2022-06-20 IMAGING — CR DG CHEST 2V
2 series · 2 of 2 positions shown · non-contrast
Comparison: None.

CLINICAL DATA: Cough

EXAM:
CHEST - 2 VIEW

[chest pa]
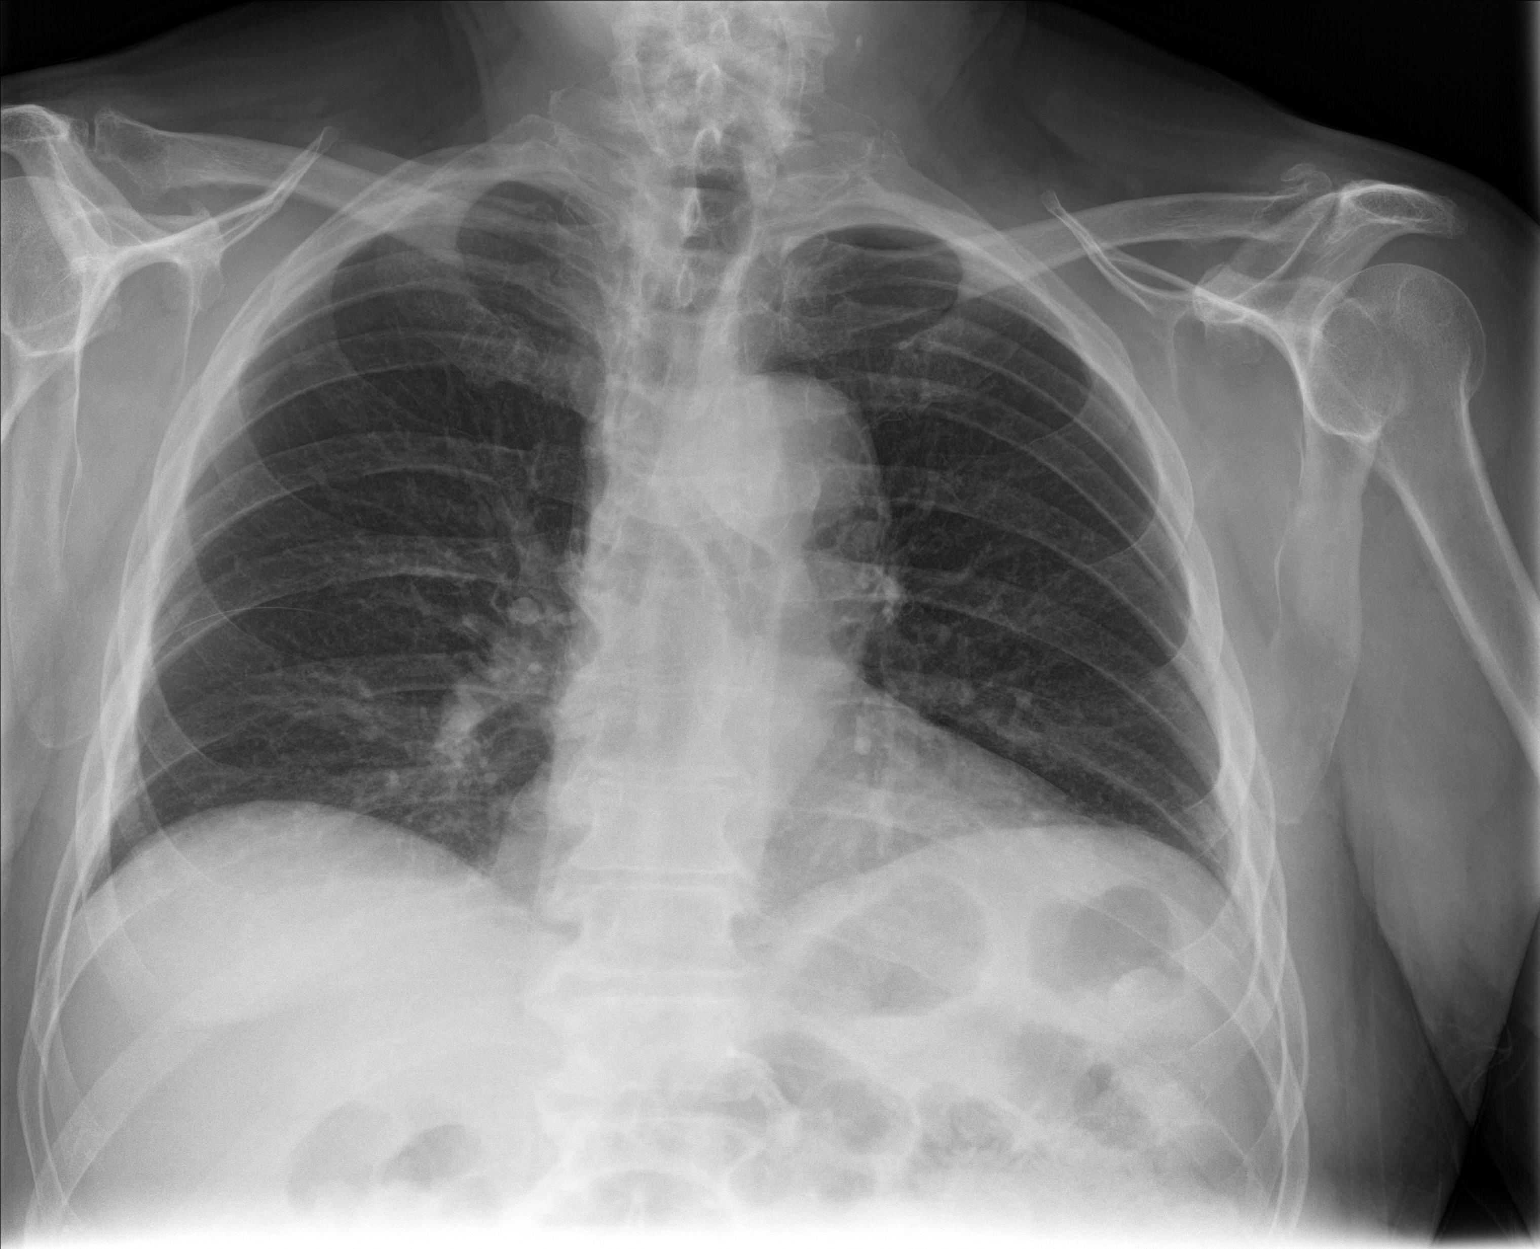

[chest lat]
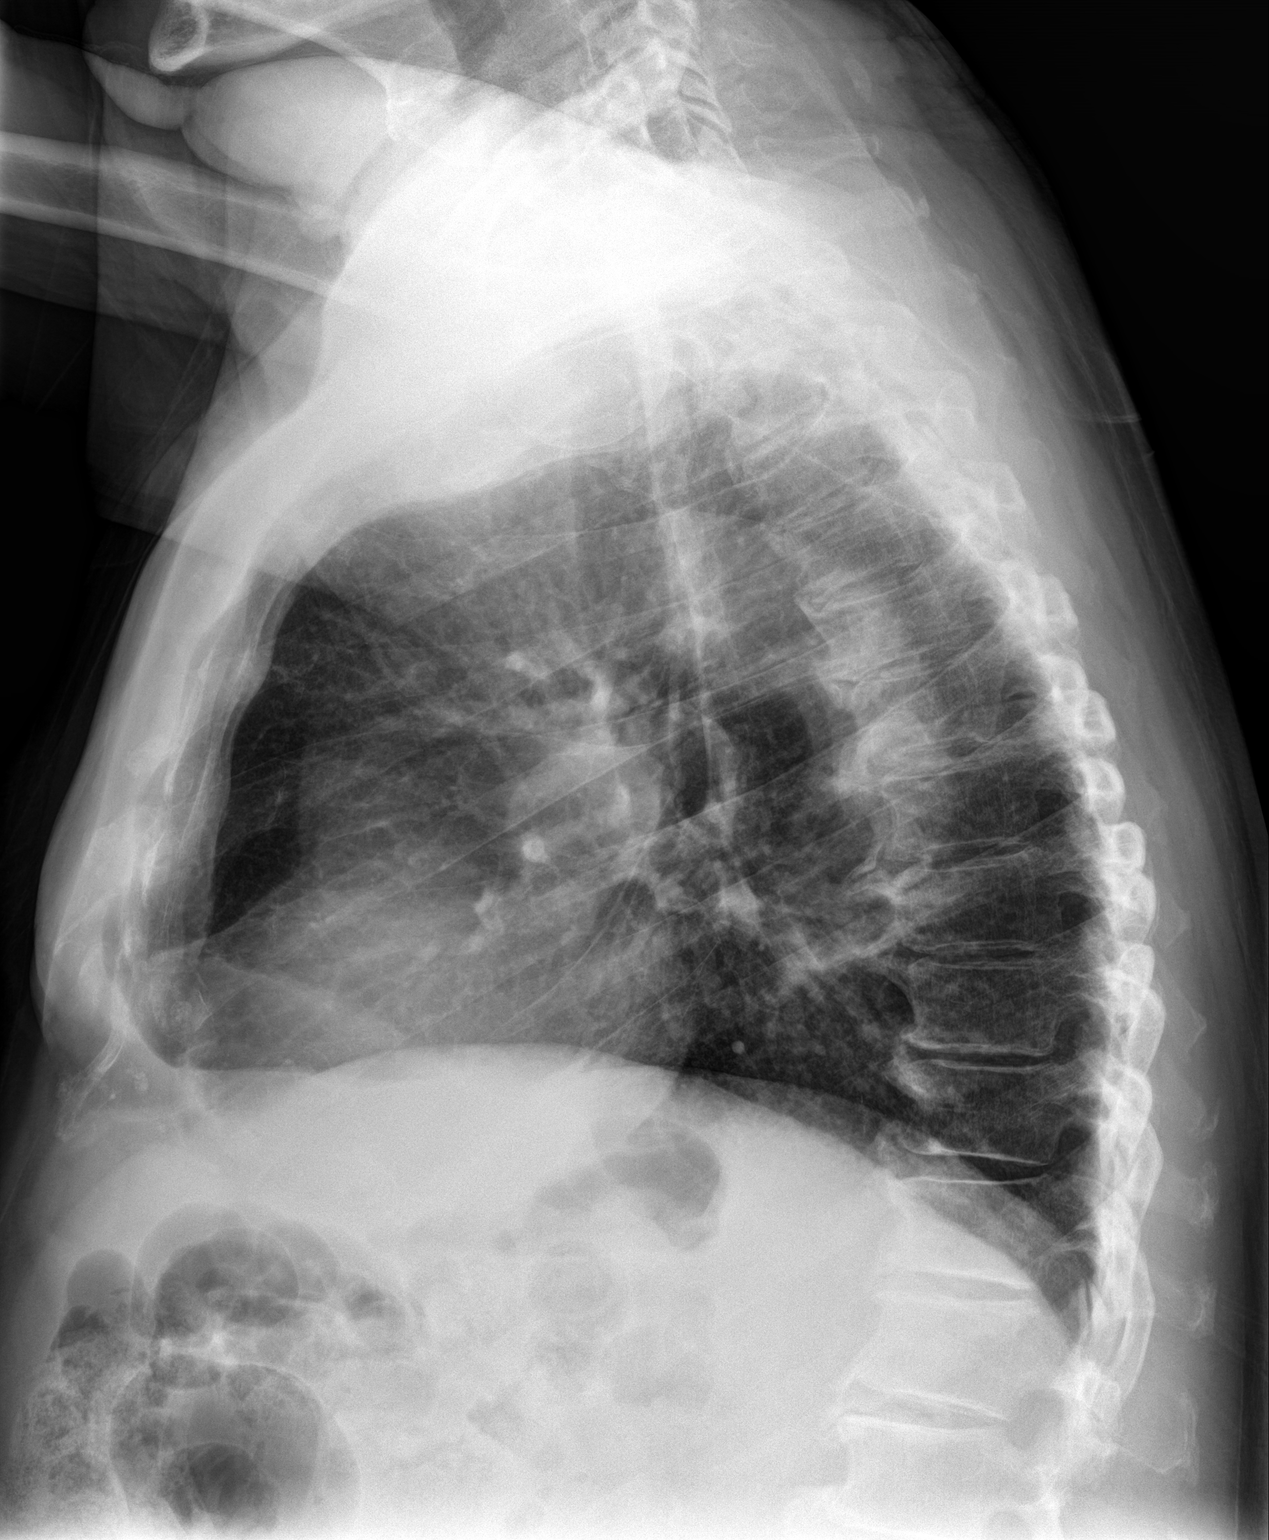

[2 of 2 positions shown; findings below may reference images not displayed]

FINDINGS: Hypoventilatory changes. No focal consolidation or effusion. Normal
cardiomediastinal silhouette. No pneumothorax. Degenerative changes
of the spine
IMPRESSION: No active cardiopulmonary disease.

## 2022-06-23 DIAGNOSIS — M48062 Spinal stenosis, lumbar region with neurogenic claudication: Secondary | ICD-10-CM | POA: Diagnosis not present

## 2022-06-23 DIAGNOSIS — M5416 Radiculopathy, lumbar region: Secondary | ICD-10-CM | POA: Diagnosis not present

## 2022-08-06 DIAGNOSIS — M21372 Foot drop, left foot: Secondary | ICD-10-CM | POA: Diagnosis not present

## 2022-08-18 ENCOUNTER — Encounter: Payer: Medicare PPO | Admitting: Family Medicine

## 2022-08-20 ENCOUNTER — Ambulatory Visit (INDEPENDENT_AMBULATORY_CARE_PROVIDER_SITE_OTHER): Payer: Medicare PPO | Admitting: Family Medicine

## 2022-08-20 VITALS — BP 130/70 | HR 61 | Ht 68.0 in | Wt 178.0 lb

## 2022-08-20 DIAGNOSIS — E7800 Pure hypercholesterolemia, unspecified: Secondary | ICD-10-CM | POA: Diagnosis not present

## 2022-08-20 DIAGNOSIS — L03116 Cellulitis of left lower limb: Secondary | ICD-10-CM

## 2022-08-20 DIAGNOSIS — Z0000 Encounter for general adult medical examination without abnormal findings: Secondary | ICD-10-CM

## 2022-08-20 DIAGNOSIS — R030 Elevated blood-pressure reading, without diagnosis of hypertension: Secondary | ICD-10-CM | POA: Diagnosis not present

## 2022-08-20 LAB — HEMOCCULT GUIAC POC 1CARD (OFFICE): Fecal Occult Blood, POC: NEGATIVE

## 2022-08-20 MED ORDER — CEPHALEXIN 500 MG PO CAPS: 500.0000 mg | ORAL_CAPSULE | Freq: Three times a day (TID) | ORAL | 0 refills | Status: DC

## 2022-08-20 MED ORDER — MUPIROCIN 2 % EX OINT: 1.0000 | TOPICAL_OINTMENT | Freq: Two times a day (BID) | CUTANEOUS | 0 refills | Status: DC
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
## 2022-08-21 ENCOUNTER — Telehealth: Payer: Self-pay

## 2022-08-21 LAB — LIPID PANEL WITH LDL/HDL RATIO
Cholesterol, Total: 154 mg/dL (ref 100–199)
HDL: 44 mg/dL (ref >39)
LDL Chol Calc (NIH): 91 mg/dL (ref 0–99)
LDL/HDL Ratio: 2.1 ratio (ref 0.0–3.6)
Triglycerides: 103 mg/dL (ref 0–149)
VLDL Cholesterol Cal: 19 mg/dL (ref 5–40)

## 2022-08-21 LAB — RENAL FUNCTION PANEL
Albumin: 4.5 g/dL (ref 3.7–4.7)
BUN/Creatinine Ratio: 15 (ref 10–24)
BUN: 15 mg/dL (ref 8–27)
CO2: 22 mmol/L (ref 20–29)
Calcium: 9.9 mg/dL (ref 8.6–10.2)
Chloride: 104 mmol/L (ref 96–106)
Creatinine, Ser: 1 mg/dL (ref 0.76–1.27)
Glucose: 91 mg/dL (ref 70–99)
Phosphorus: 3.5 mg/dL (ref 2.8–4.1)
Potassium: 4.4 mmol/L (ref 3.5–5.2)
Sodium: 142 mmol/L (ref 134–144)
eGFR: 75 mL/min/{1.73_m2} (ref >59)

## 2022-08-21 LAB — PSA: Prostate Specific Ag, Serum: <0.1 ng/mL (ref 0.0–4.0)

## 2022-08-21 NOTE — Telephone Encounter (Signed)
Error.     KP 

## 2022-09-02 ENCOUNTER — Ambulatory Visit (INDEPENDENT_AMBULATORY_CARE_PROVIDER_SITE_OTHER): Payer: Medicare PPO

## 2022-09-02 VITALS — Ht 68.0 in | Wt 178.0 lb

## 2022-09-02 DIAGNOSIS — Z0000 Encounter for general adult medical examination without abnormal findings: Secondary | ICD-10-CM | POA: Diagnosis not present

## 2022-09-02 NOTE — Progress Notes (Signed)
Subjective:   Kevin Oneal is a 82 y.o. male who presents for Medicare Annual/Subsequent preventive examination.  I connected with  Kevin Oneal on 09/02/22 by a audio enabled telemedicine application and verified that I am speaking with the correct person using two identifiers.  Patient Location: Home  Provider Location: Office/Clinic  I discussed the limitations of evaluation and management by telemedicine. The patient expressed understanding and agreed to proceed.   Review of Systems    Defer to PCP   Cardiac Risk Factors include: advanced age (>56men, >72 women);male gender     Objective:    Today's Vitals   09/02/22 1431 09/02/22 1432  Weight: 178 lb (80.7 kg)   Height: 5\' 8"  (1.727 m)   PainSc:  6    Body mass index is 27.06 kg/m.     09/02/2022    2:34 PM 09/03/2020   10:46 AM 04/09/2020   12:09 PM 12/28/2019   12:14 PM 10/19/2019    6:40 AM 08/08/2019    3:08 PM 08/04/2018    9:35 AM  Advanced Directives  Does Patient Have a Medical Advance Directive? No Yes Yes No No No No  Type of Corporate treasurer of [place];Living will Living will      Does patient want to make changes to medical advance directive?      No - Patient declined   Copy of [place] in Chart?  No - copy requested       Would patient like information on creating a medical advance directive? No - Patient declined   No - Patient declined No - Patient declined  Yes (MAU/Ambulatory/Procedural Areas - Information given)    Current Medications (verified) Outpatient Encounter Medications as of 09/02/2022  Medication Sig   Acetaminophen (TYLENOL EXTRA STRENGTH PO) Take by mouth as needed.   atorvastatin (LIPITOR) 20 MG tablet Take 20 mg by mouth daily.   Multiple Vitamins-Minerals (ONE-A-DAY MENS 50+ ADVANTAGE PO) Take 1 tablet by mouth daily.   mupirocin ointment (BACTROBAN) 2 % Apply 1 Application topically 2 (two) times daily.   Omega-3 Fatty Acids (FISH  OIL) 500 MG CAPS Take 1,000 mg by mouth daily.   timolol (TIMOPTIC) 0.5 % ophthalmic solution Place 1 drop into both eyes at bedtime.    [DISCONTINUED] cephALEXin (KEFLEX) 500 MG capsule Take 1 capsule (500 mg total) by mouth 3 (three) times daily.   No facility-administered encounter medications on file as of 09/02/2022.    Allergies (verified) Patient has no known allergies.   History: Past Medical History:  Diagnosis Date   Arthritis    hands esp right   Back pain    nerves in right leg, right thigh to foot feels numb sometimes   Cancer (HCC)    prostate   GERD (gastroesophageal reflux disease)    History of shingles    HOH (hard of hearing)    aids   Motion sickness    inner ear issues   Seasonal allergies    Past Surgical History:  Procedure Laterality Date   CARPAL TUNNEL RELEASE Bilateral    CATARACT EXTRACTION W/PHACO Right 10/19/2019   Procedure: CATARACT EXTRACTION PHACO AND INTRAOCULAR LENS PLACEMENT ([facility]) RIGHT 4.73 00:59.9 7.9%;  Surgeon: Leandrew Koyanagi, MD;  Location: [place];  Service: Ophthalmology;  Laterality: Right;   CATARACT EXTRACTION W/PHACO Left 12/28/2019   Procedure: CATARACT EXTRACTION PHACO AND INTRAOCULAR LENS PLACEMENT ([place]) LEFT TORIC LENS;  Surgeon: Leandrew Koyanagi, MD;  Location:  [place];  Service: Ophthalmology;  Laterality: Left;  5.56 1:18.7 7.0%   COLONOSCOPY  06/10/2011   Dr Candace Cruise- normal   EYE SURGERY     JOINT REPLACEMENT     LUMBAR LAMINECTOMY/DECOMPRESSION MICRODISCECTOMY N/A 04/11/2020   Procedure: Laminectomy and Foraminotomy - Lumbar two-Lumbar three, Lumbar three-Lumbar four, Lumbar four-Lumbar five;  Surgeon: Kary Kos, MD;  Location: [place];  Service: Neurosurgery;  Laterality: N/A;   PROSTATE SURGERY     removal of prostate due to cancer   REPLACEMENT TOTAL KNEE BILATERAL     UPPER GI ENDOSCOPY     Family History  Problem Relation Age of Onset   Cancer Father    Diabetes Mother    Diabetes  Brother    Social History   Socioeconomic History   Marital status: Divorced    Spouse name: Not on file   Number of children: 3   Years of education: some college   Highest education level: 12th grade  Occupational History   Occupation: Retired  Tobacco Use   Smoking status: Former    Packs/day: 0.50    Years: 11.00    Additional pack years: 0.00    Total pack years: 5.50    Types: Cigarettes    Quit date: 1980    Years since quitting: 44.2   Smokeless tobacco: Never   Tobacco comments:    smoking cessation materials not required  Vaping Use   Vaping Use: Never used  Substance and Sexual Activity   Alcohol use: Yes    Alcohol/week: 3.0 standard drinks of alcohol    Types: 3 Shots of liquor per week    Comment: usually one drink every evening   Drug use: No   Sexual activity: Yes  Other Topics Concern   Not on file  Social History Narrative   Lives with significant other   Social Determinants of Health   Financial Resource Strain: Low Risk  (09/02/2022)   Overall Financial Resource Strain (CARDIA)    Difficulty of Paying Living Expenses: Not hard at all  Food Insecurity: No Food Insecurity (09/02/2022)   Hunger Vital Sign    Worried About Running Out of Food in the Last Year: Never true    Ran Out of Food in the Last Year: Never true  Transportation Needs: No Transportation Needs (09/03/2020)   PRAPARE - Hydrologist (Medical): No    Lack of Transportation (Non-Medical): No  Physical Activity: Sufficiently Active (09/02/2022)   Exercise Vital Sign    Days of Exercise per Week: 7 days    Minutes of Exercise per Session: 30 min  Stress: Stress Concern Present (09/02/2022)   [place]    Feeling of Stress : To some extent  Social Connections: Socially Integrated (09/02/2022)   Social Connection and Isolation Panel [NHANES]    Frequency of Communication with Friends and  Family: Three times a week    Frequency of Social Gatherings with Friends and Family: Three times a week    Attends Religious Services: More than 4 times per year    Active Member of Clubs or Organizations: Yes    Attends Music therapist: More than 4 times per year    Marital Status: Living with partner    Tobacco Counseling Counseling given: Not Answered Tobacco comments: smoking cessation materials not required   Clinical Intake:  Pre-visit preparation completed: Yes  Pain : 0-10 Pain Score: 6  Pain Type: Chronic pain Pain Location: Back Pain Orientation: Lower Pain Descriptors / Indicators: Aching, Constant Pain Onset: More than a month ago Pain Frequency: Constant     BMI - recorded: 27.06 Nutritional Status: BMI 25 -29 Overweight Nutritional Risks: None Diabetes: No     Diabetic? No.  Interpreter Needed?: No  Information entered by :: Wyatt Haste, [employer] Daily [employer]    09/02/2022    2:35 PM  In your present state of health, do you have any difficulty performing the following activities:  Hearing? 0  Vision? 0  Difficulty concentrating or making decisions? 0  Walking or climbing stairs? 1  Dressing or bathing? 0  Doing errands, shopping? 0  Preparing Food and eating ? N  Using the Toilet? N  In the past six months, have you accidently leaked urine? N  Do you have problems with loss of bowel control? N  Managing your Medications? N  Managing your Finances? N  Housekeeping or managing your Housekeeping? N    Patient Care Team: Juline Patch, MD as PCP - General (Family Medicine) Alfonso Patten, MD as Consulting Physician (Dermatology) Kary Kos, MD as Consulting Physician (Neurosurgery) Leandrew Koyanagi, MD as Referring Physician (Ophthalmology) Festus Aloe, MD as Consulting Physician (Urology)  Indicate any recent Medical Services you may have received from other than [facility] providers in the past year  (date may be approximate).     Assessment:   This is a routine wellness examination for [place].  Hearing/Vision screen Hearing Screening - Comments:: No concerns. Vision Screening - Comments:: No concerns.  Dietary issues and exercise activities discussed: Current Exercise Habits: Home exercise routine, Type of exercise: stretching;walking, Time (Minutes): 30, Frequency (Times/Week): 7, Weekly Exercise (Minutes/Week): 210, Intensity: Moderate   Goals Addressed   None   Depression Screen    09/02/2022    2:34 PM 08/20/2022    9:57 AM 05/22/2022   11:10 AM 11/18/2021   11:24 AM 05/06/2021   10:39 AM 10/22/2020    1:29 PM 09/03/2020   10:38 AM  PHQ 2/9 Scores  PHQ - 2 Score 2 0 0 0 0 0 0  PHQ- 9 Score 3 0 0 0 3 0 0    Fall Risk    09/02/2022    2:35 PM 08/20/2022    9:57 AM 05/22/2022   11:10 AM 05/06/2021   10:40 AM 09/03/2020   10:46 AM  Fall Risk   Falls in the past year? 1 0 0 0 0  Number falls in past yr: 1 0 0 0 0  Injury with Fall? 1 0 0 0 0  Risk for fall due to : History of fall(s) No Fall Risks No Fall Risks No Fall Risks Orthopedic patient  Follow up Falls evaluation completed Falls evaluation completed Falls evaluation completed Falls evaluation completed Falls prevention discussed    [place]:  Any stairs in or around the home? Yes  If so, are there any without handrails? Yes  Home free of loose throw rugs in walkways, pet beds, electrical cords, etc? Yes  Adequate lighting in your home to reduce risk of falls? Yes   ASSISTIVE DEVICES UTILIZED TO PREVENT FALLS:  Life alert? No  Use of a cane, walker or w/c? Yes   TIMED UP AND GO:  Was the test performed? No .   Cognitive Function:        08/04/2018    9:44 AM 07/30/2017  9:46 AM  6CIT Screen  What Year? 0 points 0 points  What month? 0 points 0 points  What time? 0 points 0 points  Count back from 20 0 points 0 points  Months in reverse 0 points 0 points   Repeat phrase 2 points 0 points  Total Score 2 points 0 points    Immunizations Immunization History  Administered Date(s) Administered   Influenza, High Dose Seasonal PF 04/06/2018   Influenza-Unspecified 02/28/2020, 03/26/2021, 04/18/2022   PFIZER(Purple Top)SARS-COV-2 Vaccination 06/15/2019, 07/06/2019, 03/22/2020, 03/26/2021   Pneumococcal Conjugate-13 05/28/2016   Pneumococcal Polysaccharide-23 07/30/2017   Td 03/26/2015   Zoster Recombinat (Shingrix) 03/29/2019, 09/19/2019   Zoster, Live 03/26/2015    TDAP status: Up to date  Flu Vaccine status: Up to date  Pneumococcal vaccine status: Up to date  Covid-19 vaccine status: Completed vaccines  Qualifies for Shingles Vaccine? Yes   Zostavax completed Yes   Shingrix Completed?: Yes  Screening Tests Health Maintenance  Topic Date Due   Medicare Annual Wellness (AWV)  09/02/2023   DTaP/Tdap/Td (2 - Tdap) 03/25/2025   Pneumonia Vaccine 39+ Years old  Completed   INFLUENZA VACCINE  Completed   Zoster Vaccines- Shingrix  Completed   HPV VACCINES  Aged Out   COVID-19 Vaccine  Discontinued    Health Maintenance  There are no preventive care reminders to display for this patient.  Colorectal cancer screening: No longer required.   Lung Cancer Screening: (Low Dose CT Chest recommended if Age 42-80 years, 30 pack-year currently smoking OR have quit w/in 15years.) does not qualify.   Additional Screening:  Hepatitis C Screening: does not qualify;  Vision Screening: Recommended annual ophthalmology exams for early detection of glaucoma and other disorders of the eye. Is the patient up to date with their annual eye exam?  Yes  Who is the provider or what is the name of the office in which the patient attends annual eye exams? [facility]  Dental Screening: Recommended annual dental exams for proper oral hygiene  Community Resource Referral / Chronic Care Management: CRR required this visit?  No    CCM required this visit?  No      Plan:     I have personally reviewed and noted the following in the patient's chart:   Medical and social history Use of alcohol, tobacco or illicit drugs  Current medications and supplements including opioid prescriptions. Patient is not currently taking opioid prescriptions. Functional ability and status Nutritional status Physical activity Advanced directives List of other physicians Hospitalizations, surgeries, and ER visits in previous 12 months Vitals Screenings to include cognitive, depression, and falls Referrals and appointments  In addition, I have reviewed and discussed with patient certain preventive protocols, quality metrics, and best practice recommendations. A written personalized care plan for preventive services as well as general preventive health recommendations were provided to patient.     Clista Bernhardt, [place]   09/02/2022   Nurse Notes: None.

## 2022-09-03 DIAGNOSIS — H401131 Primary open-angle glaucoma, bilateral, mild stage: Secondary | ICD-10-CM | POA: Diagnosis not present

## 2022-09-10 ENCOUNTER — Other Ambulatory Visit: Payer: Self-pay | Admitting: Family Medicine

## 2022-09-10 DIAGNOSIS — L03116 Cellulitis of left lower limb: Secondary | ICD-10-CM

## 2022-09-10 NOTE — Telephone Encounter (Signed)
Requested medication (s) are due for refill today:Yes  Requested medication (s) are on the active medication list: Yes  Last refill:  08/20/22  Future visit scheduled: No  Notes to clinic:  Unable to refill per protocol, medication not assigned to the refill protocol.      Requested Prescriptions  Pending Prescriptions Disp Refills   mupirocin ointment (BACTROBAN) 2 % [Pharmacy Med Name: MUPIROCIN 2% OINTMENT] 22 g 0    Sig: APPLY TO AFFECTED AREA TWICE A DAY     Off-Protocol Failed - 09/10/2022  8:46 AM      Failed - Medication not assigned to a protocol, review manually.      Passed - Valid encounter within last 12 months    Recent Outpatient Visits           3 weeks ago Annual physical exam   [facility] [facility] [place], Deanna C, MD   3 months ago Avulsion of hamstring muscle, left, subsequent encounter   [place] [facility] [place], Deanna C, MD   6 months ago [place] [facility], Earley Abide, MD   9 months ago Acute maxillary sinusitis, recurrence not specified   [place] [facility] [place], Deanna C, MD   1 year ago Annual physical exam   [place] [facility], [facility]

## 2023-03-09 DIAGNOSIS — H401131 Primary open-angle glaucoma, bilateral, mild stage: Secondary | ICD-10-CM | POA: Diagnosis not present

## 2023-03-09 DIAGNOSIS — Z961 Presence of intraocular lens: Secondary | ICD-10-CM | POA: Diagnosis not present

## 2023-03-09 DIAGNOSIS — H40053 Ocular hypertension, bilateral: Secondary | ICD-10-CM | POA: Diagnosis not present

## 2023-04-30 ENCOUNTER — Ambulatory Visit: Payer: Medicare PPO | Admitting: Family Medicine

## 2023-06-11 DIAGNOSIS — N5201 Erectile dysfunction due to arterial insufficiency: Secondary | ICD-10-CM | POA: Diagnosis not present

## 2023-06-11 DIAGNOSIS — Z8546 Personal history of malignant neoplasm of prostate: Secondary | ICD-10-CM | POA: Diagnosis not present

## 2023-06-11 DIAGNOSIS — N393 Stress incontinence (female) (male): Secondary | ICD-10-CM | POA: Diagnosis not present

## 2023-06-15 ENCOUNTER — Other Ambulatory Visit: Payer: Self-pay | Admitting: Family Medicine

## 2023-06-15 NOTE — Telephone Encounter (Signed)
 Medication Refill -  Most Recent Primary Care Visit:  Provider: CLAUDIS EUAL SAILOR  Department: [facility]  Visit Type: NURSE VISIT  Date: 09/02/2022  Medication: Atorvastatin  20 mg  was prescribed in [place]   Has the patient contacted their pharmacy? No (Agent: If no, request that the patient contact the pharmacy for the refill. If patient does not wish to contact the pharmacy document the reason why and proceed with request.) (Agent: If yes, when and what did the pharmacy advise?)  Is this the correct pharmacy for this prescription? Yes If no, delete pharmacy and type the correct one.  This is the patient's preferred pharmacy:  [facility] [phone] [address] [place], [place] - [address] [place] [address] Phone: [phone] Fax: [phone]   Has the prescription been filled recently? Yes  Is the patient out of the medication? Yes  Has the patient been seen for an appointment in the last year OR does the patient have an upcoming appointment? Yes  Can we respond through MyChart? No  Agent: Please be advised that Rx refills may take up to 3 business days. We ask that you follow-up with your pharmacy.

## 2023-06-18 NOTE — Telephone Encounter (Signed)
 Requested medications are due for refill today.  Unsure  Requested medications are on the active medications list.  yes  Last refill. 02/27/2022   Future visit scheduled.   yes  Notes to clinic.  Medication is historical.    Requested Prescriptions  Pending Prescriptions Disp Refills   atorvastatin  (LIPITOR) 20 MG tablet      Sig: Take 1 tablet (20 mg total) by mouth daily.     Cardiovascular:  Antilipid - Statins Failed - 06/18/2023  8:34 AM      Failed - Lipid Panel in normal range within the last 12 months    Cholesterol, Total  Date Value Ref Range Status  08/20/2022 154 100 - 199 mg/dL Final   LDL Chol Calc (NIH)  Date Value Ref Range Status  08/20/2022 91 0 - 99 mg/dL Final   HDL  Date Value Ref Range Status  08/20/2022 44 >39 mg/dL Final   Triglycerides  Date Value Ref Range Status  08/20/2022 103 0 - 149 mg/dL Final         Passed - Patient is not pregnant      Passed - Valid encounter within last 12 months    Recent Outpatient Visits           10 months ago Annual physical exam   [facility] Joshua Cathryne JAYSON, MD   1 year ago Avulsion of hamstring muscle, left, subsequent encounter   [place] Joshua Cathryne JAYSON, MD   1 year ago COVID   [facility], Selinda PARAS, MD   1 year ago Acute maxillary sinusitis, recurrence not specified   [place] [facility] Joshua Cathryne JAYSON, MD   1 year ago Annual physical exam   [facility] Joshua Cathryne JAYSON, MD       Future Appointments             In 2 months Joshua Cathryne JAYSON, MD [facility] at [facility], [facility]

## 2023-08-12 ENCOUNTER — Telehealth: Payer: Self-pay | Admitting: Family Medicine

## 2023-08-12 NOTE — Telephone Encounter (Signed)
 Patient; Kevin Oneal  Phone: [phone] Provider: Elizabeth Sauer  Called patient to schedule AWV.  He stated that he had a physical on 08/28/2023 at 10am and he would like for someone to call him to verify if he need fasting labs for the physical.   Please advise.  Thanks [employer]

## 2023-08-28 ENCOUNTER — Ambulatory Visit (INDEPENDENT_AMBULATORY_CARE_PROVIDER_SITE_OTHER): Payer: Medicare PPO | Admitting: Family Medicine

## 2023-08-28 ENCOUNTER — Encounter: Payer: Self-pay | Admitting: Family Medicine

## 2023-08-28 VITALS — BP 132/84 | HR 68 | Resp 16 | Ht 68.0 in | Wt 177.8 lb

## 2023-08-28 DIAGNOSIS — Z0000 Encounter for general adult medical examination without abnormal findings: Secondary | ICD-10-CM

## 2023-08-28 NOTE — Progress Notes (Signed)
 Date:  08/28/2023   Name:  Kevin Oneal   DOB:  02/12/1941   MRN:  [id number]   Chief Complaint: Annual Exam  Patient is a 83 year old male who presents for a comprehensive physical exam. The patient reports the following problems: none. Health maintenance has been reviewed up to date     Lab Results  Component Value Date   NA 142 08/20/2022   K 4.4 08/20/2022   CO2 22 08/20/2022   GLUCOSE 91 08/20/2022   BUN 15 08/20/2022   CREATININE 1.00 08/20/2022   CALCIUM 9.9 08/20/2022   EGFR 75 08/20/2022   GFRNONAA >60 04/09/2020   Lab Results  Component Value Date   CHOL 154 08/20/2022   HDL 44 08/20/2022   LDLCALC 91 08/20/2022   TRIG 103 08/20/2022   CHOLHDL 4.0 08/09/2019   No results found for: "TSH" No results found for: "HGBA1C" Lab Results  Component Value Date   WBC 7.1 04/09/2020   HGB 16.8 04/09/2020   HCT 48.3 04/09/2020   MCV 90.6 04/09/2020   PLT 222 04/09/2020   Lab Results  Component Value Date   ALT 22 04/09/2020   AST 22 04/09/2020   ALKPHOS 75 04/09/2020   BILITOT 1.3 (H) 04/09/2020   No results found for: "25OHVITD2", "25OHVITD3", "VD25OH"   Review of Systems  Constitutional:  Negative for chills and fever.  HENT:  Negative for drooling, ear discharge, ear pain and sore throat.   Respiratory:  Negative for cough, shortness of breath and wheezing.   Cardiovascular:  Negative for chest pain, palpitations and leg swelling.  Gastrointestinal:  Negative for abdominal pain, blood in stool, constipation, diarrhea and nausea.  Endocrine: Negative for polydipsia.  Genitourinary:  Negative for dysuria, frequency, hematuria and urgency.  Musculoskeletal:  Negative for back pain, myalgias and neck pain.  Skin:  Negative for rash.  Allergic/Immunologic: Negative for environmental allergies.  Neurological:  Negative for dizziness and headaches.  Hematological:  Does not bruise/bleed easily.  Psychiatric/Behavioral:  Negative for suicidal ideas. The  patient is not nervous/anxious.     Patient Active Problem List   Diagnosis Date Noted   COVID 03/12/2022   Spinal stenosis of lumbar region 04/11/2020   Body mass index (BMI) 28.0-28.9, adult 03/20/2020   Elevated blood-pressure reading, without diagnosis of hypertension 03/20/2020   Neurogenic claudication 02/02/2020   Rotator cuff syndrome of left shoulder 06/30/2018   Primary osteoarthritis of first carpometacarpal joint of right hand 09/08/2017   Hand pain, right 09/08/2017   Pure hypercholesterolemia 07/30/2017   OA (osteoarthritis) of knee 10/05/2013   Personal history of other malignant neoplasm of skin 02/28/2013   Barrett's esophagus 03/17/2012    No Known Allergies  Past Surgical History:  Procedure Laterality Date   CARPAL TUNNEL RELEASE Bilateral    CATARACT EXTRACTION W/PHACO Right 10/19/2019   Procedure: CATARACT EXTRACTION PHACO AND INTRAOCULAR LENS PLACEMENT (IOC) RIGHT 4.73 00:59.9 7.9%;  Surgeon: Lockie Mola, MD;  Location: [facility];  Service: Ophthalmology;  Laterality: Right;   CATARACT EXTRACTION W/PHACO Left 12/28/2019   Procedure: CATARACT EXTRACTION PHACO AND INTRAOCULAR LENS PLACEMENT (IOC) LEFT TORIC LENS;  Surgeon: Lockie Mola, MD;  Location: [facility];  Service: Ophthalmology;  Laterality: Left;  5.56 1:18.7 7.0%   COLONOSCOPY  06/10/2011   Dr Bluford Kaufmann- normal   EYE SURGERY     JOINT REPLACEMENT     LUMBAR LAMINECTOMY/DECOMPRESSION MICRODISCECTOMY N/A 04/11/2020   Procedure: Laminectomy and Foraminotomy - Lumbar two-Lumbar three,  Lumbar three-Lumbar four, Lumbar four-Lumbar five;  Surgeon: Donalee Citrin, MD;  Location: [facility] OR;  Service: Neurosurgery;  Laterality: N/A;   PROSTATE SURGERY     removal of prostate due to cancer   REPLACEMENT TOTAL KNEE BILATERAL     UPPER GI ENDOSCOPY      Social History   Tobacco Use   Smoking status: Former    Current packs/day: 0.00    Average packs/day: 0.5 packs/day for 11.0 years  (5.5 ttl pk-yrs)    Types: Cigarettes    Start date: 21    Quit date: 31    Years since quitting: 45.2   Smokeless tobacco: Never   Tobacco comments:    smoking cessation materials not required  Vaping Use   Vaping status: Never Used  Substance Use Topics   Alcohol use: Yes    Alcohol/week: 3.0 standard drinks of alcohol    Types: 3 Shots of liquor per week    Comment: usually one drink every evening   Drug use: No     Medication list has been reviewed and updated.  Current Meds  Medication Sig   Acetaminophen (TYLENOL EXTRA STRENGTH PO) Take by mouth as needed.   atorvastatin (LIPITOR) 20 MG tablet Take 20 mg by mouth daily.   Multiple Vitamins-Minerals (ONE-A-DAY MENS 50+ ADVANTAGE PO) Take 1 tablet by mouth daily.   mupirocin ointment (BACTROBAN) 2 % Apply 1 Application topically 2 (two) times daily.   Omega-3 Fatty Acids (FISH OIL) 500 MG CAPS Take 1,000 mg by mouth daily.   timolol (TIMOPTIC) 0.5 % ophthalmic solution Place 1 drop into both eyes at bedtime.        08/20/2022    9:57 AM 05/22/2022   11:10 AM 11/18/2021   11:24 AM 05/06/2021   10:39 AM  GAD 7 : Generalized Anxiety Score  Nervous, Anxious, on Edge 0 0 0 0  Control/stop worrying 0 0 0 0  Worry too much - different things 0 0 0 0  Trouble relaxing 0 0 0 0  Restless 0 0 0 0  Easily annoyed or irritable 0 0 0 0  Afraid - awful might happen 0 0 0 0  Total GAD 7 Score 0 0 0 0  Anxiety Difficulty Not difficult at all Not difficult at all Not difficult at all        08/28/2023   10:06 AM 09/02/2022    2:34 PM 08/20/2022    9:57 AM  Depression screen PHQ 2/9  Decreased Interest 0 1 0  Down, Depressed, Hopeless 0 1 0  PHQ - 2 Score 0 2 0  Altered sleeping  0 0  Tired, decreased energy  1 0  Change in appetite  0 0  Feeling bad or failure about yourself   0 0  Trouble concentrating  0 0  Moving slowly or fidgety/restless  0 0  Suicidal thoughts  0 0  PHQ-9 Score  3 0  Difficult doing  work/chores  Not difficult at all Not difficult at all    BP Readings from Last 3 Encounters:  08/28/23 132/84  08/20/22 130/70  05/22/22 136/84    Physical Exam Vitals and nursing note reviewed.  Constitutional:      Appearance: Normal appearance. He is well-developed and overweight.  HENT:     Head: Normocephalic and atraumatic.     Jaw: There is normal jaw occlusion.     Right Ear: Tympanic membrane, ear canal and external ear normal. Decreased hearing noted.  Left Ear: Tympanic membrane, ear canal and external ear normal. Decreased hearing noted.     Nose: Nose normal.     Mouth/Throat:     Lips: Pink.     Mouth: Mucous membranes are moist.     Dentition: Normal dentition.     Pharynx: Oropharynx is clear. Uvula midline.  Eyes:     General: Lids are normal. Vision grossly intact. Gaze aligned appropriately. No scleral icterus.    Extraocular Movements: Extraocular movements intact.     Conjunctiva/sclera: Conjunctivae normal.     Pupils: Pupils are equal, round, and reactive to light.  Neck:     Thyroid: No thyromegaly.     Vascular: No carotid bruit, hepatojugular reflux or JVD.     Trachea: Trachea and phonation normal. No tracheal deviation.  Cardiovascular:     Rate and Rhythm: Normal rate and regular rhythm.     Chest Wall: PMI is not displaced.     Pulses: Normal pulses.          Carotid pulses are 2+ on the right side and 2+ on the left side.      Radial pulses are 2+ on the right side and 2+ on the left side.       Femoral pulses are 2+ on the right side and 2+ on the left side.      Popliteal pulses are 2+ on the right side and 2+ on the left side.       Dorsalis pedis pulses are 2+ on the right side and 2+ on the left side.       Posterior tibial pulses are 2+ on the right side and 2+ on the left side.     Heart sounds: Normal heart sounds, S1 normal and S2 normal.     No systolic murmur is present.     No diastolic murmur is present.     No gallop. No  S3 or S4 sounds.  Pulmonary:     Effort: Pulmonary effort is normal.     Breath sounds: Normal breath sounds. No decreased breath sounds, wheezing, rhonchi or rales.  Chest:  Breasts:    Breasts are symmetrical.     Right: Normal.     Left: Normal.  Abdominal:     General: Bowel sounds are normal. There is no distension or abdominal bruit.     Palpations: Abdomen is soft. There is no hepatomegaly, splenomegaly or mass.     Tenderness: There is no abdominal tenderness.     Hernia: No hernia is present. There is no hernia in the umbilical area, ventral area, left inguinal area or right inguinal area.  Genitourinary:    Penis: Uncircumcised.      Testes: Normal.        Right: Mass not present.        Left: Mass not present.     Prostate: Normal. Not enlarged, not tender and no nodules present.     Rectum: Normal. Guaiac result negative. No mass.  Musculoskeletal:        General: Normal range of motion.     Cervical back: Neck supple.     Right lower leg: No edema.     Left lower leg: No edema.  Lymphadenopathy:     Head:     Right side of head: No submental, submandibular or tonsillar adenopathy.     Left side of head: No submental, submandibular or tonsillar adenopathy.     Cervical: No cervical adenopathy.  Right cervical: No superficial, deep or posterior cervical adenopathy.    Left cervical: No superficial, deep or posterior cervical adenopathy.     Upper Body:     Right upper body: No supraclavicular or axillary adenopathy.     Left upper body: No supraclavicular or axillary adenopathy.  Skin:    General: Skin is warm and dry.     Capillary Refill: Capillary refill takes less than 2 seconds.     Findings: No rash.  Neurological:     Mental Status: He is alert and oriented to person, place, and time.     Cranial Nerves: Cranial nerves 2-12 are intact.     Sensory: No sensory deficit.     Motor: Motor function is intact.     Deep Tendon Reflexes: Reflexes are normal  and symmetric.     Reflex Scores:      Tricep reflexes are 2+ on the right side and 2+ on the left side.      Bicep reflexes are 2+ on the right side and 2+ on the left side.      Brachioradialis reflexes are 2+ on the right side and 2+ on the left side.      Patellar reflexes are 2+ on the right side and 2+ on the left side.      Achilles reflexes are 2+ on the right side and 2+ on the left side. Psychiatric:        Mood and Affect: Mood is not anxious or depressed.        Behavior: Behavior is cooperative.     Wt Readings from Last 3 Encounters:  08/28/23 177 lb 12.8 oz (80.6 kg)  09/02/22 178 lb (80.7 kg)  08/20/22 178 lb (80.7 kg)    BP 132/84   Pulse 68   Resp 16   Ht 5\' 8"  (1.727 m)   Wt 177 lb 12.8 oz (80.6 kg)   SpO2 97%   BMI 27.03 kg/m   Assessment and Plan: Kevin Oneal is a 83 y.o. male who presents today for his Complete Annual Exam. He feels well. He reports exercising squats. He reports he is sleeping poorly.  Immunizations are reviewed and recommendations provided.   Age appropriate screening tests are discussed. Counseling given for risk factor reduction interventions.  1. Annual physical exam (Primary) No subjective/objective concerns noted during HPI, review of past medical history/review of past labs within the last year/review of medications, review of systems and physical exam.  Will obtain PSA and lipid as well as CMP for baseline labs for physical exam.  Will recheck patient for physical in 1 year. - PSA - Lipid Panel With LDL/HDL Ratio - Comprehensive metabolic panel    Elizabeth Sauer, MD

## 2023-08-29 LAB — COMPREHENSIVE METABOLIC PANEL
ALT: 17 IU/L (ref 0–44)
AST: 24 IU/L (ref 0–40)
Albumin: 4.4 g/dL (ref 3.7–4.7)
Alkaline Phosphatase: 104 IU/L (ref 44–121)
BUN/Creatinine Ratio: 15 (ref 10–24)
BUN: 14 mg/dL (ref 8–27)
Bilirubin Total: 1.1 mg/dL (ref 0.0–1.2)
CO2: 25 mmol/L (ref 20–29)
Calcium: 9.4 mg/dL (ref 8.6–10.2)
Chloride: 104 mmol/L (ref 96–106)
Creatinine, Ser: 0.94 mg/dL (ref 0.76–1.27)
Globulin, Total: 2.4 g/dL (ref 1.5–4.5)
Glucose: 94 mg/dL (ref 70–99)
Potassium: 4.7 mmol/L (ref 3.5–5.2)
Sodium: 142 mmol/L (ref 134–144)
Total Protein: 6.8 g/dL (ref 6.0–8.5)
eGFR: 80 mL/min/{1.73_m2} (ref >59)

## 2023-08-29 LAB — LIPID PANEL WITH LDL/HDL RATIO
Cholesterol, Total: 204 mg/dL — ABNORMAL HIGH (ref 100–199)
HDL: 43 mg/dL (ref >39)
LDL Chol Calc (NIH): 138 mg/dL — ABNORMAL HIGH (ref 0–99)
LDL/HDL Ratio: 3.2 ratio (ref 0.0–3.6)
Triglycerides: 127 mg/dL (ref 0–149)
VLDL Cholesterol Cal: 23 mg/dL (ref 5–40)

## 2023-08-29 LAB — PSA: Prostate Specific Ag, Serum: <0.1 ng/mL (ref 0.0–4.0)

## 2023-09-01 NOTE — Progress Notes (Signed)
 Spoke with patient, reviewed Dr. Yetta Barre lab results and instructions with him. Patient verbalized understanding. Plans to come back the first week of May to redo fasting lipid panel.

## 2023-09-23 ENCOUNTER — Ambulatory Visit: Payer: Self-pay

## 2023-09-23 NOTE — Telephone Encounter (Signed)
 Chief Complaint: knee and toe pain Symptoms: right knee and toe pain Frequency: today Pertinent Negatives: Patient denies  Disposition: [] ED /[] Urgent Care (no appt availability in office) / [x] Appointment(In office/virtual)/ []  [facility]/ [] Home Care/ [] Refused Recommended Disposition /[] [facility]/ []  Follow-up with PCP Additional Notes: pt states he fell and hurt his right knee and right big toe. States he had so skin tears to the right knee but he has cleaned them and put antibiotic ointment on and the toe nail of his right big toe is bruised and painful. Pt offered appt today but state his wife has appt today and he can't miss it. Scheduled appt for tomorrow and instructed if worse to go to ED.   Copied from CRM [id number]. Topic: Clinical - Red Word Triage >> Sep 23, 2023  2:38 PM Zipporah Him wrote: Red Word that prompted transfer to Nurse Triage: Patient fell this morning and hurt his big toe in a lot of pain Reason for Disposition  [1] Very swollen joint AND [2] no fever  Answer Assessment - Initial Assessment Questions 1. LOCATION and RADIATION: "Where is the pain located?"      Right knee and big toe 2. QUALITY: "What does the pain feel like?"  (e.g., sharp, dull, aching, burning)     Hurt when pressure 3. SEVERITY: "How bad is the pain?" "What does it keep you from doing?"   (Scale 1-10; or mild, moderate, severe)   -  MILD (1-3): doesn't interfere with normal activities    -  MODERATE (4-7): interferes with normal activities (e.g., work or school) or awakens from sleep, limping    -  SEVERE (8-10): excruciating pain, unable to do any normal activities, unable to walk     moderate 4. ONSET: "When did the pain start?" "Does it come and go, or is it there all the time?"     This morning 5. RECURRENT: "Have you had this pain before?" If Yes, ask: "When, and what happened then?"     no 6. SETTING: "Has there been any recent work, exercise or other activity that  involved that part of the body?"      Fell  7. AGGRAVATING FACTORS: "What makes the knee pain worse?" (e.g., walking, climbing stairs, running)     walking 8. ASSOCIATED SYMPTOMS: "Is there any swelling or redness of the knee?"     Swelling and skin torn 9. OTHER SYMPTOMS: "Do you have any other symptoms?" (e.g., chest pain, difficulty breathing, fever, calf pain)     Toe pain  Protocols used: Knee Pain-A-AH

## 2023-09-24 ENCOUNTER — Ambulatory Visit: Admitting: Family Medicine

## 2023-09-24 NOTE — Telephone Encounter (Signed)
 Pt had a appt. Pt cancelled appt. KP

## 2023-10-05 ENCOUNTER — Encounter: Payer: Self-pay | Admitting: Physician Assistant

## 2023-10-05 ENCOUNTER — Ambulatory Visit (INDEPENDENT_AMBULATORY_CARE_PROVIDER_SITE_OTHER): Admitting: Physician Assistant

## 2023-10-05 VITALS — BP 128/84 | HR 60 | Temp 97.8°F | Ht 68.0 in | Wt 177.0 lb

## 2023-10-05 DIAGNOSIS — S80211A Abrasion, right knee, initial encounter: Secondary | ICD-10-CM

## 2023-10-05 NOTE — Progress Notes (Signed)
 Date:  10/05/2023   Name:  Kevin Oneal   DOB:  [birth year]   MRN:  [id number]   Chief Complaint: Knee Pain (Fall X1 week ago, right knee, seems like its infected )  Fall Incident onset: X 1 weeks. The fall occurred while walking. He fell from a height of 3 to 5 ft. He landed on [place]. There was no blood loss. The point of impact was the right knee. The pain is present in the right knee. The pain is at a severity of 0/10. The patient is experiencing no pain. Associated symptoms comments: Redness, scabbing, mass. He has tried nothing for the symptoms.   Kevin Oneal is an 83 y.o. male presenting for right knee injury after a fall two weeks ago 09/23/23.  He typically sees my colleague Dr. Alayne Allis, MD for routine care.  He explains that he has a left foot drop for which he uses a brace most of the time, but on the day of the fall he was not using his left foot brace, and states the foot tripped him up and then fall onto his right knee which was scraped.  He does not suspect any fractures.  Immediately following the injury, he cleaned the wound with alcohol, applied antibiotic ointment, and bandaged.  More recently, he has not been bandaging the scabs to allow them to breathe.  He had some erythema and edema near the patella, but he states this has dramatically improved over the last 24 hours.  His main concern is preventing a joint infection, noting that he has had bilateral knee replacement.  Thankfully he does not have any significant pain with walking, or any change in his range of motion from baseline.  He just wanted provider to look at it.   Medication list has been reviewed and updated.  Current Meds  Medication Sig   Acetaminophen  (TYLENOL  EXTRA STRENGTH PO) Take by mouth as needed.   atorvastatin (LIPITOR) 20 MG tablet Take 20 mg by mouth daily.   Multiple Vitamins-Minerals (ONE-A-DAY MENS 50+ ADVANTAGE PO) Take 1 tablet by mouth daily.   mupirocin  ointment (BACTROBAN ) 2 % Apply 1  Application topically 2 (two) times daily.   Omega-3 Fatty Acids (FISH OIL) 500 MG CAPS Take 1,000 mg by mouth daily.   timolol  (TIMOPTIC ) 0.5 % ophthalmic solution Place 1 drop into both eyes at bedtime.      Review of Systems  Patient Active Problem List   Diagnosis Date Noted   COVID 03/12/2022   Spinal stenosis of lumbar region 04/11/2020   Body mass index (BMI) 28.0-28.9, adult 03/20/2020   Elevated blood-pressure reading, without diagnosis of hypertension 03/20/2020   Neurogenic claudication 02/02/2020   Rotator cuff syndrome of left shoulder 06/30/2018   Primary osteoarthritis of first carpometacarpal joint of right hand 09/08/2017   Hand pain, right 09/08/2017   Pure hypercholesterolemia 07/30/2017   OA (osteoarthritis) of knee 10/05/2013   Personal history of other malignant neoplasm of skin 02/28/2013   Barrett's esophagus 03/17/2012    No Known Allergies  Immunization History  Administered Date(s) Administered   Influenza, High Dose Seasonal PF 04/06/2018, 04/16/2023   Influenza-Unspecified 02/28/2020, 03/26/2021, 04/18/2022   PFIZER(Purple Top)SARS-COV-2 Vaccination 06/15/2019, 07/06/2019, 03/22/2020, 03/26/2021   PNEUMOCOCCAL CONJUGATE-20 05/02/2023   Pneumococcal Conjugate-13 05/28/2016   Pneumococcal Polysaccharide-23 07/30/2017   Td 03/26/2015   Zoster Recombinant(Shingrix) 03/29/2019, 09/19/2019   Zoster, Live 03/26/2015    Past Surgical History:  Procedure Laterality Date   CARPAL TUNNEL RELEASE  Bilateral    CATARACT EXTRACTION W/PHACO Right 10/19/2019   Procedure: CATARACT EXTRACTION PHACO AND INTRAOCULAR LENS PLACEMENT (IOC) RIGHT 4.73 00:59.9 7.9%;  Surgeon: Annell Kidney, MD;  Location: [facility];  Service: Ophthalmology;  Laterality: Right;   CATARACT EXTRACTION W/PHACO Left 12/28/2019   Procedure: CATARACT EXTRACTION PHACO AND INTRAOCULAR LENS PLACEMENT (IOC) LEFT TORIC LENS;  Surgeon: Annell Kidney, MD;  Location: [facility];  Service: Ophthalmology;  Laterality: Left;  5.56 1:18.7 7.0%   COLONOSCOPY  06/10/2011   Dr Janine Melbourne- normal   EYE SURGERY     JOINT REPLACEMENT     LUMBAR LAMINECTOMY/DECOMPRESSION MICRODISCECTOMY N/A 04/11/2020   Procedure: Laminectomy and Foraminotomy - Lumbar two-Lumbar three, Lumbar three-Lumbar four, Lumbar four-Lumbar five;  Surgeon: Gearl Keens, MD;  Location: [facility] OR;  Service: Neurosurgery;  Laterality: N/A;   PROSTATE SURGERY     removal of prostate due to cancer   REPLACEMENT TOTAL KNEE BILATERAL     UPPER GI ENDOSCOPY      Social History   Tobacco Use   Smoking status: Former    Current packs/day: 0.00    Average packs/day: 0.5 packs/day for 11.0 years (5.5 ttl pk-yrs)    Types: Cigarettes    Start date: 22    Quit date: 57    Years since quitting: 45.3   Smokeless tobacco: Never   Tobacco comments:    smoking cessation materials not required  Vaping Use   Vaping status: Never Used  Substance Use Topics   Alcohol use: Yes    Alcohol/week: 3.0 standard drinks of alcohol    Types: 3 Shots of liquor per week    Comment: usually one drink every evening   Drug use: No    Family History  Problem Relation Age of Onset   Cancer Father    Diabetes Mother    Diabetes Brother         08/20/2022    9:57 AM 05/22/2022   11:10 AM 11/18/2021   11:24 AM 05/06/2021   10:39 AM  GAD 7 : Generalized Anxiety Score  Nervous, Anxious, on Edge 0 0 0 0  Control/stop worrying 0 0 0 0  Worry too much - different things 0 0 0 0  Trouble relaxing 0 0 0 0  Restless 0 0 0 0  Easily annoyed or irritable 0 0 0 0  Afraid - awful might happen 0 0 0 0  Total GAD 7 Score 0 0 0 0  Anxiety Difficulty Not difficult at all Not difficult at all Not difficult at all        08/28/2023   10:06 AM 09/02/2022    2:34 PM 08/20/2022    9:57 AM  Depression screen PHQ 2/9  Decreased Interest 0 1 0  Down, Depressed, Hopeless 0 1 0  PHQ - 2 Score 0 2 0  Altered sleeping  0 0   Tired, decreased energy  1 0  Change in appetite  0 0  Feeling bad or failure about yourself   0 0  Trouble concentrating  0 0  Moving slowly or fidgety/restless  0 0  Suicidal thoughts  0 0  PHQ-9 Score  3 0  Difficult doing work/chores  Not difficult at all Not difficult at all    BP Readings from Last 3 Encounters:  10/05/23 128/84  08/28/23 132/84  08/20/22 130/70    Wt Readings from Last 3 Encounters:  10/05/23 177 lb (80.3 kg)  08/28/23 177 lb 12.8 oz (  80.6 kg)  09/02/22 178 lb (80.7 kg)    BP 128/84   Pulse 60   Temp 97.8 F (36.6 C)   Ht 5\' 8"  (1.727 m)   Wt 177 lb (80.3 kg)   SpO2 96%   BMI 26.91 kg/m   Physical Exam Vitals and nursing note reviewed.  Constitutional:      Appearance: Normal appearance.  Cardiovascular:     Rate and Rhythm: Normal rate.  Pulmonary:     Effort: Pulmonary effort is normal.  Abdominal:     General: There is no distension.  Musculoskeletal:        General: Normal range of motion.     Comments: R knee with superficial scabbed abrasion with an area of localized pink edema roughly 2.5-3 cm across, mildly tender with some soft and mobile fluctuance but no spontaneous discharge. The knee joint appears to be unaffected, with no tenderness of the patella itself and no pain through ROM or varus/valgus stress. Gait is at baseline, ambulating without assistance.   Skin:    General: Skin is warm and dry.  Neurological:     Mental Status: He is alert and oriented to person, place, and time.     Gait: Gait is intact.  Psychiatric:        Mood and Affect: Mood and affect normal.        Recent Labs     Component Value Date/Time   NA 142 08/28/2023 1112   K 4.7 08/28/2023 1112   CL 104 08/28/2023 1112   CO2 25 08/28/2023 1112   GLUCOSE 94 08/28/2023 1112   GLUCOSE 87 04/09/2020 1216   BUN 14 08/28/2023 1112   CREATININE 0.94 08/28/2023 1112   CALCIUM 9.4 08/28/2023 1112   PROT 6.8 08/28/2023 1112   ALBUMIN  4.4 08/28/2023  1112   AST 24 08/28/2023 1112   ALT 17 08/28/2023 1112   ALKPHOS 104 08/28/2023 1112   BILITOT 1.1 08/28/2023 1112   GFRNONAA >60 04/09/2020 1216   GFRAA 83 08/09/2019 0959    Lab Results  Component Value Date   WBC 7.1 04/09/2020   HGB 16.8 04/09/2020   HCT 48.3 04/09/2020   MCV 90.6 04/09/2020   PLT 222 04/09/2020   No results found for: "HGBA1C" Lab Results  Component Value Date   CHOL 204 (H) 08/28/2023   HDL 43 08/28/2023   LDLCALC 138 (H) 08/28/2023   TRIG 127 08/28/2023   CHOLHDL 4.0 08/09/2019   No results found for: "TSH"   Assessment and Plan:  1. Abrasion of right knee, initial encounter (Primary) Discussed with patient overall reassuring exam, especially since the swelling and erythema has much improved since yesterday per patient report.  Site of patient concern has been documented with photos today.  No reason to suspect fracture.    There are signs of local inflammatory reaction, but I have low suspicion for infection at this time especially any infection that would involve the joint.  Will hold off on systemic antibiotics at this time, though I encouraged patient to contact our office at the first sign of worsening.  In the meantime, suggested that he apply triple antibiotic ointment to the scab over the patella with a bandage overnight, then allow the area to breathe during the day.  He verbalizes understanding.   F/u PRN   Cody Das, PA-C, DMSc, Nutritionist [facility] [phone]

## 2023-10-08 ENCOUNTER — Encounter: Payer: Self-pay | Admitting: Family Medicine

## 2023-10-08 ENCOUNTER — Ambulatory Visit (INDEPENDENT_AMBULATORY_CARE_PROVIDER_SITE_OTHER): Admitting: Family Medicine

## 2023-10-08 ENCOUNTER — Ambulatory Visit: Admitting: Emergency Medicine

## 2023-10-08 VITALS — BP 110/76 | HR 65 | Ht 66.0 in | Wt 176.2 lb

## 2023-10-08 VITALS — Ht 66.0 in | Wt 177.0 lb

## 2023-10-08 DIAGNOSIS — Z0000 Encounter for general adult medical examination without abnormal findings: Secondary | ICD-10-CM | POA: Diagnosis not present

## 2023-10-08 DIAGNOSIS — L0100 Impetigo, unspecified: Secondary | ICD-10-CM

## 2023-10-08 DIAGNOSIS — L03116 Cellulitis of left lower limb: Secondary | ICD-10-CM

## 2023-10-08 MED ORDER — MUPIROCIN 2 % EX OINT: 1.0000 | TOPICAL_OINTMENT | Freq: Two times a day (BID) | CUTANEOUS | 0 refills | Status: AC

## 2023-10-08 MED ORDER — AMOXICILLIN-POT CLAVULANATE 875-125 MG PO TABS
1.0000 | ORAL_TABLET | Freq: Two times a day (BID) | ORAL | 0 refills | Status: DC
Start: 2023-10-08 — End: 2024-03-23

## 2023-10-08 NOTE — Progress Notes (Signed)
 Date:  10/08/2023   Name:  Kevin Oneal   DOB:  1941-04-30   MRN:  [id number]   Chief Complaint: Abrasion (Right knee/)  Patient is a 83 year old male who presents for a comprehensive physical exam. The patient reports the following problems: rechecabrasion. Health maintenance has been reviewed up to date.      Lab Results  Component Value Date   NA 142 08/28/2023   K 4.7 08/28/2023   CO2 25 08/28/2023   GLUCOSE 94 08/28/2023   BUN 14 08/28/2023   CREATININE 0.94 08/28/2023   CALCIUM 9.4 08/28/2023   EGFR 80 08/28/2023   GFRNONAA >60 04/09/2020   Lab Results  Component Value Date   CHOL 204 (H) 08/28/2023   HDL 43 08/28/2023   LDLCALC 138 (H) 08/28/2023   TRIG 127 08/28/2023   CHOLHDL 4.0 08/09/2019   No results found for: "TSH" No results found for: "HGBA1C" Lab Results  Component Value Date   WBC 7.1 04/09/2020   HGB 16.8 04/09/2020   HCT 48.3 04/09/2020   MCV 90.6 04/09/2020   PLT 222 04/09/2020   Lab Results  Component Value Date   ALT 17 08/28/2023   AST 24 08/28/2023   ALKPHOS 104 08/28/2023   BILITOT 1.1 08/28/2023   No results found for: "25OHVITD2", "25OHVITD3", "VD25OH"   Review of Systems  Constitutional:  Negative for chills and fever.  Respiratory:  Negative for chest tightness and shortness of breath.   Cardiovascular:  Negative for chest pain, palpitations and leg swelling.  Skin:  Positive for wound. Negative for color change, pallor and rash.    Patient Active Problem List   Diagnosis Date Noted   COVID 03/12/2022   Spinal stenosis of lumbar region 04/11/2020   Body mass index (BMI) 28.0-28.9, adult 03/20/2020   Elevated blood-pressure reading, without diagnosis of hypertension 03/20/2020   Neurogenic claudication 02/02/2020   Rotator cuff syndrome of left shoulder 06/30/2018   Primary osteoarthritis of first carpometacarpal joint of right hand 09/08/2017   Hand pain, right 09/08/2017   Pure hypercholesterolemia 07/30/2017    OA (osteoarthritis) of knee 10/05/2013   Personal history of other malignant neoplasm of skin 02/28/2013   Barrett's esophagus 03/17/2012    No Known Allergies  Past Surgical History:  Procedure Laterality Date   CARPAL TUNNEL RELEASE Bilateral    CATARACT EXTRACTION W/PHACO Right 10/19/2019   Procedure: CATARACT EXTRACTION PHACO AND INTRAOCULAR LENS PLACEMENT (IOC) RIGHT 4.73 00:59.9 7.9%;  Surgeon: Annell Kidney, MD;  Location: [facility];  Service: Ophthalmology;  Laterality: Right;   CATARACT EXTRACTION W/PHACO Left 12/28/2019   Procedure: CATARACT EXTRACTION PHACO AND INTRAOCULAR LENS PLACEMENT (IOC) LEFT TORIC LENS;  Surgeon: Annell Kidney, MD;  Location: [facility];  Service: Ophthalmology;  Laterality: Left;  5.56 1:18.7 7.0%   COLONOSCOPY  06/10/2011   Dr Janine Melbourne- normal   EYE SURGERY     JOINT REPLACEMENT     LUMBAR LAMINECTOMY/DECOMPRESSION MICRODISCECTOMY N/A 04/11/2020   Procedure: Laminectomy and Foraminotomy - Lumbar two-Lumbar three, Lumbar three-Lumbar four, Lumbar four-Lumbar five;  Surgeon: Gearl Keens, MD;  Location: [facility] OR;  Service: Neurosurgery;  Laterality: N/A;   PROSTATE SURGERY     removal of prostate due to cancer   REPLACEMENT TOTAL KNEE BILATERAL     UPPER GI ENDOSCOPY      Social History   Tobacco Use   Smoking status: Former    Current packs/day: 0.00    Average packs/day: 0.5 packs/day for  11.0 years (5.5 ttl pk-yrs)    Types: Cigarettes    Start date: 3    Quit date: 45    Years since quitting: 45.3    Passive exposure: Never   Smokeless tobacco: Never   Tobacco comments:    smoking cessation materials not required  Vaping Use   Vaping status: Never Used  Substance Use Topics   Alcohol use: Yes    Alcohol/week: 7.0 standard drinks of alcohol    Types: 7 Shots of liquor per week    Comment: one drink (vodka and tonic) every evening at 5 o'clock   Drug use: No     Medication list has been reviewed and  updated.  Current Meds  Medication Sig   amoxicillin -clavulanate (AUGMENTIN ) 875-125 MG tablet Take 1 tablet by mouth 2 (two) times daily.   diphenhydramine-acetaminophen  (TYLENOL  PM) 25-500 MG TABS tablet Take 1 tablet by mouth at bedtime as needed.   ibuprofen  (ADVIL ) 200 MG tablet Take 600 mg by mouth every 6 (six) hours as needed.   Multiple Vitamins-Minerals (ONE-A-DAY MENS 50+ ADVANTAGE PO) Take 1 tablet by mouth daily.   timolol  (TIMOPTIC ) 0.5 % ophthalmic solution Place 1 drop into both eyes at bedtime.        08/20/2022    9:57 AM 05/22/2022   11:10 AM 11/18/2021   11:24 AM 05/06/2021   10:39 AM  GAD 7 : Generalized Anxiety Score  Nervous, Anxious, on Edge 0 0 0 0  Control/stop worrying 0 0 0 0  Worry too much - different things 0 0 0 0  Trouble relaxing 0 0 0 0  Restless 0 0 0 0  Easily annoyed or irritable 0 0 0 0  Afraid - awful might happen 0 0 0 0  Total GAD 7 Score 0 0 0 0  Anxiety Difficulty Not difficult at all Not difficult at all Not difficult at all        10/08/2023   10:14 AM 08/28/2023   10:06 AM 09/02/2022    2:34 PM  Depression screen PHQ 2/9  Decreased Interest 0 0 1  Down, Depressed, Hopeless 1 0 1  PHQ - 2 Score 1 0 2  Altered sleeping 1  0  Tired, decreased energy 1  1  Change in appetite 0  0  Feeling bad or failure about yourself  0  0  Trouble concentrating 0  0  Moving slowly or fidgety/restless 0  0  Suicidal thoughts 0  0  PHQ-9 Score 3  3  Difficult doing work/chores Not difficult at all  Not difficult at all    BP Readings from Last 3 Encounters:  10/08/23 110/76  10/05/23 128/84  08/28/23 132/84    Physical Exam Vitals and nursing note reviewed.  Constitutional:      Appearance: He is well-developed.  HENT:     Head: Normocephalic and atraumatic.     Right Ear: Tympanic membrane, ear canal and external ear normal.     Left Ear: Tympanic membrane, ear canal and external ear normal.     Nose: Nose normal.     Mouth/Throat:      Mouth: Mucous membranes are moist.     Dentition: Normal dentition.  Eyes:     General: Lids are normal. No scleral icterus.    Conjunctiva/sclera: Conjunctivae normal.     Pupils: Pupils are equal, round, and reactive to light.  Neck:     Thyroid: No thyromegaly.     Vascular: No carotid  bruit, hepatojugular reflux or JVD.     Trachea: No tracheal deviation.  Cardiovascular:     Rate and Rhythm: Normal rate and regular rhythm.     Heart sounds: Normal heart sounds.  Pulmonary:     Effort: Pulmonary effort is normal.     Breath sounds: Normal breath sounds. No wheezing, rhonchi or rales.  Abdominal:     General: Bowel sounds are normal.     Palpations: Abdomen is soft. There is no hepatomegaly, splenomegaly or mass.     Tenderness: There is no abdominal tenderness.     Hernia: There is no hernia in the left inguinal area.  Musculoskeletal:     Cervical back: Neck supple.  Lymphadenopathy:     Cervical: No cervical adenopathy.  Skin:    General: Skin is warm.     Capillary Refill: Capillary refill takes less than 2 seconds.  Neurological:     Mental Status: He is alert.     Deep Tendon Reflexes: Reflexes are normal and symmetric.  Psychiatric:        Mood and Affect: Mood is not anxious or depressed.     Wt Readings from Last 3 Encounters:  10/08/23 176 lb 4 oz (79.9 kg)  10/08/23 177 lb (80.3 kg)  10/05/23 177 lb (80.3 kg)    BP 110/76   Pulse 65   Ht 5\' 6"  (1.676 m)   Wt 176 lb 4 oz (79.9 kg)   SpO2 96%   BMI 28.45 kg/m   Assessment and Plan: 1. Impetigo (Primary) New onset.  Rather persistent.  Stable.  There is honey crusted area that is only on the main area of the perforation.  Minimal surrounding erythema there is no purulence underneath the removed crusting.  We will clean debride apply antibacterial antibiotic ointment.  Will initiate Augmentin  875 mg twice a day.  Will recheck patient on as-needed basis.  There is no palpable fluctuance to suggest  abscess nor is there is surrounding erythema to indicate cellulitis. - mupirocin  ointment (BACTROBAN ) 2 %; Apply 1 Application topically 2 (two) times daily.  Dispense: 22 g; Refill: 0 - amoxicillin -clavulanate (AUGMENTIN ) 875-125 MG tablet; Take 1 tablet by mouth 2 (two) times daily.  Dispense: 20 tablet; Refill: 0      Alayne Allis, MD

## 2023-10-08 NOTE — Progress Notes (Signed)
 Subjective:   Kevin Oneal is a 83 y.o. who presents for a Medicare Wellness preventive visit.  Visit Complete: Virtual I connected with  Kevin Oneal on 10/08/23 by a audio enabled telemedicine application and verified that I am speaking with the correct person using two identifiers.  Patient Location: Home  Provider Location: Home Office  I discussed the limitations of evaluation and management by telemedicine. The patient expressed understanding and agreed to proceed.  Vital Signs: Because this visit was a virtual/telehealth visit, some criteria may be missing or patient reported. Any vitals not documented were not able to be obtained and vitals that have been documented are patient reported.  VideoDeclined- This patient declined Librarian, academic. Therefore the visit was completed with audio only.  Persons Participating in Visit: Patient.  AWV Questionnaire: No: Patient Medicare AWV questionnaire was not completed prior to this visit.  Cardiac Risk Factors include: advanced age (>65men, >29 women);male gender;dyslipidemia     Objective:    Today's Vitals   10/08/23 0957 10/08/23 0958  Weight: 177 lb (80.3 kg)   Height: 5\' 6"  (1.676 m)   PainSc:  3    Body mass index is 28.57 kg/m.     10/08/2023   10:18 AM 09/02/2022    2:34 PM 09/03/2020   10:46 AM 04/09/2020   12:09 PM 12/28/2019   12:14 PM 10/19/2019    6:40 AM 08/08/2019    3:08 PM  Advanced Directives  Does Patient Have a Medical Advance Directive? No No Yes Yes No No No  Type of Best boy of [place];Living will Living will     Does patient want to make changes to medical advance directive?       No - Patient declined  Copy of Healthcare Power of Attorney in Chart?   No - copy requested      Would patient like information on creating a medical advance directive? No - Patient declined No - Patient declined   No - Patient declined No - Patient declined      Current Medications (verified) Outpatient Encounter Medications as of 10/08/2023  Medication Sig   diphenhydramine-acetaminophen  (TYLENOL  PM) 25-500 MG TABS tablet Take 1 tablet by mouth at bedtime as needed.   ibuprofen  (ADVIL ) 200 MG tablet Take 600 mg by mouth every 6 (six) hours as needed.   Multiple Vitamins-Minerals (ONE-A-DAY MENS 50+ ADVANTAGE PO) Take 1 tablet by mouth daily.   timolol  (TIMOPTIC ) 0.5 % ophthalmic solution Place 1 drop into both eyes at bedtime.    Acetaminophen  (TYLENOL  EXTRA STRENGTH PO) Take by mouth as needed. (Patient not taking: Reported on 10/08/2023)   atorvastatin (LIPITOR) 20 MG tablet Take 20 mg by mouth daily. (Patient not taking: Reported on 10/08/2023)   mupirocin  ointment (BACTROBAN ) 2 % Apply 1 Application topically 2 (two) times daily. (Patient not taking: Reported on 10/08/2023)   Omega-3 Fatty Acids (FISH OIL) 500 MG CAPS Take 1,000 mg by mouth daily. (Patient not taking: Reported on 10/08/2023)   No facility-administered encounter medications on file as of 10/08/2023.    Allergies (verified) Patient has no known allergies.   History: Past Medical History:  Diagnosis Date   Arthritis    hands esp right   Back pain    nerves in right leg, right thigh to foot feels numb sometimes   Cancer (HCC)    prostate   GERD (gastroesophageal reflux disease)    History of shingles  HOH (hard of hearing)    aids   Motion sickness    inner ear issues   Seasonal allergies    Past Surgical History:  Procedure Laterality Date   CARPAL TUNNEL RELEASE Bilateral    CATARACT EXTRACTION W/PHACO Right 10/19/2019   Procedure: CATARACT EXTRACTION PHACO AND INTRAOCULAR LENS PLACEMENT (IOC) RIGHT 4.73 00:59.9 7.9%;  Surgeon: Annell Kidney, MD;  Location: [facility];  Service: Ophthalmology;  Laterality: Right;   CATARACT EXTRACTION W/PHACO Left 12/28/2019   Procedure: CATARACT EXTRACTION PHACO AND INTRAOCULAR LENS PLACEMENT (IOC) LEFT TORIC LENS;   Surgeon: Annell Kidney, MD;  Location: [facility];  Service: Ophthalmology;  Laterality: Left;  5.56 1:18.7 7.0%   COLONOSCOPY  06/10/2011   Dr Janine Melbourne- normal   EYE SURGERY     JOINT REPLACEMENT     LUMBAR LAMINECTOMY/DECOMPRESSION MICRODISCECTOMY N/A 04/11/2020   Procedure: Laminectomy and Foraminotomy - Lumbar two-Lumbar three, Lumbar three-Lumbar four, Lumbar four-Lumbar five;  Surgeon: Gearl Keens, MD;  Location: [facility] OR;  Service: Neurosurgery;  Laterality: N/A;   PROSTATE SURGERY     removal of prostate due to cancer   REPLACEMENT TOTAL KNEE BILATERAL     UPPER GI ENDOSCOPY     Family History  Problem Relation Age of Onset   Cancer Father    Diabetes Mother    Diabetes Brother    Social History   Socioeconomic History   Marital status: Significant Other    Spouse name: Not on file   Number of children: 3   Years of education: some college   Highest education level: 12th grade  Occupational History   Occupation: Retired  Tobacco Use   Smoking status: Former    Current packs/day: 0.00    Average packs/day: 0.5 packs/day for 11.0 years (5.5 ttl pk-yrs)    Types: Cigarettes    Start date: 1969    Quit date: 1980    Years since quitting: 45.3    Passive exposure: Never   Smokeless tobacco: Never   Tobacco comments:    smoking cessation materials not required  Vaping Use   Vaping status: Never Used  Substance and Sexual Activity   Alcohol use: Yes    Alcohol/week: 7.0 standard drinks of alcohol    Types: 7 Shots of liquor per week    Comment: one drink (vodka and tonic) every evening at 5 o'clock   Drug use: No   Sexual activity: Yes  Other Topics Concern   Not on file  Social History Narrative   Lives with significant other   Social Drivers of Health   Financial Resource Strain: Low Risk  (10/08/2023)   Overall Financial Resource Strain (CARDIA)    Difficulty of Paying Living Expenses: Not hard at all  Food Insecurity: No Food Insecurity (10/08/2023)    Hunger Vital Sign    Worried About Running Out of Food in the Last Year: Never true    Ran Out of Food in the Last Year: Never true  Transportation Needs: No Transportation Needs (10/08/2023)   PRAPARE - Administrator, Civil Service (Medical): No    Lack of Transportation (Non-Medical): No  Physical Activity: Insufficiently Active (10/08/2023)   Exercise Vital Sign    Days of Exercise per Week: 3 days    Minutes of Exercise per Session: 20 min  Stress: Stress Concern Present (10/08/2023)   [employer] of Occupational Health - Occupational Stress Questionnaire    Feeling of Stress : To some extent  Social  Connections: Socially Integrated (10/08/2023)   Social Connection and Isolation Panel [NHANES]    Frequency of Communication with Friends and Family: More than three times a week    Frequency of Social Gatherings with Friends and Family: More than three times a week    Attends Religious Services: More than 4 times per year    Active Member of [employer] or Organizations: Yes    Attends Engineer, structural: More than 4 times per year    Marital Status: Living with partner    Tobacco Counseling Counseling given: No Tobacco comments: smoking cessation materials not required    Clinical Intake:  Pre-visit preparation completed: Yes  Pain : 0-10 Pain Score: 3  Pain Type: Chronic pain Pain Location: Back Pain Descriptors / Indicators: Aching     BMI - recorded: 28.57 Nutritional Status: BMI 25 -29 Overweight Nutritional Risks: None Diabetes: No  No results found for: "HGBA1C"   How often do you need to have someone help you when you read instructions, pamphlets, or other written materials from your doctor or pharmacy?: 1 - Never  Interpreter Needed?: No  Information entered by :: Jaunita Messier, CMA   Activities of Daily Living     10/08/2023   10:01 AM  In your present state of health, do you have any difficulty performing the following activities:   Hearing? 1  Comment wears hearing aids  Vision? 0  Difficulty concentrating or making decisions? 0  Walking or climbing stairs? 1  Comment uses cane prn for drop foot  Dressing or bathing? 0  Doing errands, shopping? 0  Preparing Food and eating ? N  Using the Toilet? N  In the past six months, have you accidently leaked urine? Y  Comment history of prostate cancer, wears depends sometimes  Do you have problems with loss of bowel control? N  Managing your Medications? N  Managing your Finances? N  Housekeeping or managing your Housekeeping? N    Patient Care Team: Clarise Crooks, MD as PCP - General (Family Medicine) Gearl Keens, MD as Consulting Physician (Neurosurgery) Annell Kidney, MD as Referring Physician (Ophthalmology) Christina Coyer, MD as Consulting Physician (Urology) Dingeldein, Landon Pinion, MD (Ophthalmology)  Indicate any recent Medical Services you may have received from other than [facility] providers in the past year (date may be approximate).     Assessment:   This is a routine wellness examination for [place].  Hearing/Vision screen Hearing Screening - Comments:: Wears hearing aids Vision Screening - Comments:: Gets eye exams, Dr. Dingeldein, [place]   Goals Addressed             This Visit's Progress    Patient Stated       Get a bike that works both arms and legs       Depression Screen     10/08/2023   10:14 AM 08/28/2023   10:06 AM 09/02/2022    2:34 PM 08/20/2022    9:57 AM 05/22/2022   11:10 AM 11/18/2021   11:24 AM 05/06/2021   10:39 AM  PHQ 2/9 Scores  PHQ - 2 Score 1 0 2 0 0 0 0  PHQ- 9 Score 3  3 0 0 0 3    Fall Risk     10/08/2023   10:19 AM 08/28/2023   10:06 AM 09/02/2022    2:35 PM 08/20/2022    9:57 AM 05/22/2022   11:10 AM  Fall Risk   Falls in the past year? 1 1 1  0  0  Number falls in past yr: 1 1 1  0 0  Injury with Fall? 1 1 1  0 0  Risk for fall due to : History of fall(s);Impaired balance/gait;Orthopedic  patient History of fall(s) History of fall(s) No Fall Risks No Fall Risks  Follow up Falls prevention discussed;Falls evaluation completed;Education provided  Falls evaluation completed Falls evaluation completed Falls evaluation completed    MEDICARE RISK AT HOME:  Medicare Risk at Home Any stairs in or around the home?: Yes If so, are there any without handrails?: No Home free of loose throw rugs in walkways, pet beds, electrical cords, etc?: Yes Adequate lighting in your home to reduce risk of falls?: Yes Life alert?: No Use of a cane, walker or w/c?: Yes (uses cane prn) Grab bars in the bathroom?: Yes Shower chair or bench in shower?: No Elevated toilet seat or a handicapped toilet?: Yes  TIMED UP AND GO:  Was the test performed?  No  Cognitive Function: 6CIT completed        10/08/2023   10:21 AM 08/04/2018    9:44 AM 07/30/2017    9:46 AM  6CIT Screen  What Year? 0 points 0 points 0 points  What month? 0 points 0 points 0 points  What time? 0 points 0 points 0 points  Count back from 20 0 points 0 points 0 points  Months in reverse 2 points 0 points 0 points  Repeat phrase 0 points 2 points 0 points  Total Score 2 points 2 points 0 points    Immunizations Immunization History  Administered Date(s) Administered   Influenza, High Dose Seasonal PF 04/06/2018, 04/16/2023   Influenza-Unspecified 02/28/2020, 03/26/2021, 04/18/2022   PFIZER(Purple Top)SARS-COV-2 Vaccination 06/15/2019, 07/06/2019, 03/22/2020, 03/26/2021   PNEUMOCOCCAL CONJUGATE-20 05/02/2023   Pneumococcal Conjugate-13 05/28/2016   Pneumococcal Polysaccharide-23 07/30/2017   Td 03/26/2015   Zoster Recombinant(Shingrix) 03/29/2019, 09/19/2019   Zoster, Live 03/26/2015    Screening Tests Health Maintenance  Topic Date Due   INFLUENZA VACCINE  01/08/2024   Medicare Annual Wellness (AWV)  10/07/2024   DTaP/Tdap/Td (2 - Tdap) 03/25/2025   Pneumonia Vaccine 57+ Years old  Completed   Zoster Vaccines-  Shingrix  Completed   HPV VACCINES  Aged Out   Meningococcal B Vaccine  Aged Out   COVID-19 Vaccine  Discontinued    Health Maintenance  There are no preventive care reminders to display for this patient.  Health Maintenance Items Addressed: See Nurse Notes  Additional Screening:  Vision Screening: Recommended annual ophthalmology exams for early detection of glaucoma and other disorders of the eye.  Dental Screening: Recommended annual dental exams for proper oral hygiene  Community Resource Referral / Chronic Care Management: CRR required this visit?  No   CCM required this visit?  No     Plan:     I have personally reviewed and noted the following in the patient's chart:   Medical and social history Use of alcohol, tobacco or illicit drugs  Current medications and supplements including opioid prescriptions. Patient is not currently taking opioid prescriptions. Functional ability and status Nutritional status Physical activity Advanced directives List of other physicians Hospitalizations, surgeries, and ER visits in previous 12 months Vitals Screenings to include cognitive, depression, and falls Referrals and appointments  In addition, I have reviewed and discussed with patient certain preventive protocols, quality metrics, and best practice recommendations. A written personalized care plan for preventive services as well as general preventive health recommendations were provided to patient.  Jaunita Messier, CMA   10/08/2023   After Visit Summary: (Declined) Due to this being a telephonic visit, with patients personalized plan was offered to patient but patient Declined AVS at this time   Notes: Please refer to Routing Comments.

## 2023-10-08 NOTE — Patient Instructions (Addendum)
 Kevin Oneal , Thank you for taking time to come for your Medicare Wellness Visit. I appreciate your ongoing commitment to your health goals. Please review the following plan we discussed and let me know if I can assist you in the future.   Referrals/Orders/Follow-Ups/Clinician Recommendations: Keep up the good work!  This is a list of the screening recommended for you and due dates:  Health Maintenance  Topic Date Due   Flu Shot  01/08/2024   Medicare Annual Wellness Visit  10/07/2024   DTaP/Tdap/Td vaccine (2 - Tdap) 03/25/2025   Pneumonia Vaccine  Completed   Zoster (Shingles) Vaccine  Completed   HPV Vaccine  Aged Out   Meningitis B Vaccine  Aged Out   COVID-19 Vaccine  Discontinued    Advanced directives: (Declined) Advance directive discussed with you today. Even though you declined this today, please call our office should you change your mind, and we can give you the proper paperwork for you to fill out.  Next Medicare Annual Wellness Visit scheduled for next year: Yes, 10/20/24 @ 10:00 am (phone visit)  Fall Prevention in the Home, Adult Falls can cause injuries and affect people of all ages. There are many simple things that you can do to make your home safe and to help prevent falls. If you need it, ask for help making these changes. What actions can I take to prevent falls? General information Use good lighting in all rooms. Make sure to: Replace any light bulbs that burn out. Turn on lights if it is dark and use night-lights. Keep items that you use often in easy-to-reach places. Lower the shelves around your home if needed. Move furniture so that there are clear paths around it. Do not keep throw rugs or other things on the floor that can make you trip. If any of your floors are uneven, fix them. Add color or contrast paint or tape to clearly mark and help you see: Grab bars or handrails. First and last steps of staircases. Where the edge of each step is. If you use a  ladder or stepladder: Make sure that it is fully opened. Do not climb a closed ladder. Make sure the sides of the ladder are locked in place. Have someone hold the ladder while you use it. Know where your pets are as you move through your home. What can I do in the bathroom?     Keep the floor dry. Clean up any water that is on the floor right away. Remove soap buildup in the bathtub or shower. Buildup makes bathtubs and showers slippery. Use non-skid mats or decals on the floor of the bathtub or shower. Attach bath mats securely with double-sided, non-slip rug tape. If you need to sit down while you are in the shower, use a non-slip stool. Install grab bars by the toilet and in the bathtub and shower. Do not use towel bars as grab bars. What can I do in the bedroom? Make sure that you have a light by your bed that is easy to reach. Do not use any sheets or blankets on your bed that hang to the floor. Have a firm bench or chair with side arms that you can use for support when you get dressed. What can I do in the kitchen? Clean up any spills right away. If you need to reach something above you, use a sturdy step stool that has a grab bar. Keep electrical cables out of the way. Do not use floor polish  or wax that makes floors slippery. What can I do with my stairs? Do not leave anything on the stairs. Make sure that you have a light switch at the top and the bottom of the stairs. Have them installed if you do not have them. Make sure that there are handrails on both sides of the stairs. Fix handrails that are broken or loose. Make sure that handrails are as long as the staircases. Install non-slip stair treads on all stairs in your home if they do not have carpet. Avoid having throw rugs at the top or bottom of stairs, or secure the rugs with carpet tape to prevent them from moving. Choose a carpet design that does not hide the edge of steps on the stairs. Make sure that carpet is firmly  attached to the stairs. Fix any carpet that is loose or worn. What can I do on the outside of my home? Use bright outdoor lighting. Repair the edges of walkways and driveways and fix any cracks. Clear paths of anything that can make you trip, such as tools or rocks. Add color or contrast paint or tape to clearly mark and help you see high doorway thresholds. Trim any bushes or trees on the main path into your home. Check that handrails are securely fastened and in good repair. Both sides of all steps should have handrails. Install guardrails along the edges of any raised decks or porches. Have leaves, snow, and ice cleared regularly. Use sand, salt, or ice melt on walkways during winter months if you live where there is ice and snow. In the garage, clean up any spills right away, including grease or oil spills. What other actions can I take? Review your medicines with your health care provider. Some medicines can make you confused or feel dizzy. This can increase your chance of falling. Wear closed-toe shoes that fit well and support your feet. Wear shoes that have rubber soles and low heels. Use a cane, walker, scooter, or crutches that help you move around if needed. Talk with your provider about other ways that you can decrease your risk of falls. This may include seeing a physical therapist to learn to do exercises to improve movement and strength. Where to find more information Centers for Disease Control and Prevention, STEADI: TonerPromos.no [employer] on Aging: BaseRingTones.pl National Institute on Aging: BaseRingTones.pl Contact a health care provider if: You are afraid of falling at home. You feel weak, drowsy, or dizzy at home. You fall at home. Get help right away if you: Lose consciousness or have trouble moving after a fall. Have a fall that causes a head injury. These symptoms may be an emergency. Get help right away. Call 911. Do not wait to see if the symptoms will go away. Do  not drive yourself to the hospital. This information is not intended to replace advice given to you by your health care provider. Make sure you discuss any questions you have with your health care provider. Document Revised: 01/27/2022 Document Reviewed: 01/27/2022 Elsevier Patient Education  2024 [employer].

## 2023-10-09 NOTE — Addendum Note (Signed)
 Addended by: Clarise Crooks on: 10/09/2023 12:03 PM   Modules accepted: Level of Service

## 2023-10-13 ENCOUNTER — Other Ambulatory Visit: Payer: Self-pay

## 2023-10-13 DIAGNOSIS — E7800 Pure hypercholesterolemia, unspecified: Secondary | ICD-10-CM

## 2023-10-14 LAB — LIPID PANEL WITH LDL/HDL RATIO
Cholesterol, Total: 204 mg/dL — ABNORMAL HIGH (ref 100–199)
HDL: 52 mg/dL (ref >39)
LDL Chol Calc (NIH): 136 mg/dL — ABNORMAL HIGH (ref 0–99)
LDL/HDL Ratio: 2.6 ratio (ref 0.0–3.6)
Triglycerides: 88 mg/dL (ref 0–149)
VLDL Cholesterol Cal: 16 mg/dL (ref 5–40)

## 2023-10-15 NOTE — Progress Notes (Signed)
 No answer to phone call. No VM message left. No VM mail box available.

## 2023-10-16 ENCOUNTER — Telehealth: Payer: Self-pay

## 2023-10-16 NOTE — Telephone Encounter (Signed)
 Spoke with patient regarding lab results, he stated that he has not been taking the atorvastatin for 6 months because he did not have any medication. He would like to know if he can get a prescription for the 20 mg before increasing it to 40 mg.

## 2023-10-16 NOTE — Telephone Encounter (Signed)
-----   Message from Alayne Allis sent at 10/15/2023  6:43 AM EDT ----- LDL cholesterol is elevated I like to reemphasize that he follow his dietary guidelines and I need to go ahead and increase the atorvastatin to 40 mg a day if you will call and inquire if this would be okay with patient and proceed with 40 mg a day and we need to recheck his lipids in 6 to 8 weeks.

## 2023-10-17 ENCOUNTER — Other Ambulatory Visit: Payer: Self-pay | Admitting: Family Medicine

## 2023-10-17 MED ORDER — ATORVASTATIN CALCIUM 20 MG PO TABS: 20.0000 mg | ORAL_TABLET | Freq: Every day | ORAL | 0 refills | Status: DC

## 2023-10-20 ENCOUNTER — Other Ambulatory Visit: Payer: Self-pay

## 2023-10-20 DIAGNOSIS — E7800 Pure hypercholesterolemia, unspecified: Secondary | ICD-10-CM

## 2023-10-20 NOTE — Telephone Encounter (Signed)
Orders placed.  KP 

## 2024-01-26 ENCOUNTER — Ambulatory Visit: Admitting: Student

## 2024-01-26 ENCOUNTER — Ambulatory Visit: Payer: Self-pay

## 2024-01-26 ENCOUNTER — Encounter: Payer: Self-pay | Admitting: Student

## 2024-01-26 VITALS — BP 112/70 | HR 59 | Ht 66.0 in | Wt 177.2 lb

## 2024-01-26 DIAGNOSIS — M21372 Foot drop, left foot: Secondary | ICD-10-CM

## 2024-01-26 MED ORDER — ATORVASTATIN CALCIUM 20 MG PO TABS: 20.0000 mg | ORAL_TABLET | Freq: Every day | ORAL | 0 refills | Status: DC

## 2024-01-26 NOTE — Progress Notes (Unsigned)
 Established Patient Office Visit  Subjective   Patient ID: Kevin Oneal, male    DOB: 04/26/1941  Age: 83 y.o. MRN: [id number]  Chief Complaint  Patient presents with   Circulatory Problem    Patient states he has been having leg swelling and circulation problems for about 4 - 6 weeks    Kevin Oneal with medical hx listed below presents today for coolness of the LLE for the past 2 months. Has had difficulties with left foot drop since since decompression of the L2-L5 in 2021 for spinal stenosis.  Last saw Dr. Onetha in 2024 and has been using a foot brace since then but has not followed up since then. Reports over time left calf has been getting smaller and unable to move ankle well. He does not experience any pain, swelling, numbness, for tingling of the left foot. Had granddaughter who is in nursing school check pulses in his feet and noted left pulses were weaker than the left. He is able to walk well with the foot brace and has no claudications symptoms. He bikes regularly and squats and does not have any leg pain.   Patient Active Problem List   Diagnosis Date Noted   COVID 03/12/2022   Spinal stenosis of lumbar region 04/11/2020   Body mass index (BMI) 28.0-28.9, adult 03/20/2020   Elevated blood-pressure reading, without diagnosis of hypertension 03/20/2020   Neurogenic claudication 02/02/2020   Rotator cuff syndrome of left shoulder 06/30/2018   Primary osteoarthritis of first carpometacarpal joint of right hand 09/08/2017   Hand pain, right 09/08/2017   Pure hypercholesterolemia 07/30/2017   OA (osteoarthritis) of knee 10/05/2013   Personal history of other malignant neoplasm of skin 02/28/2013   Barrett's esophagus 03/17/2012      ROS Refer to HPI    Objective:     Outpatient Encounter Medications as of 01/26/2024  Medication Sig   amoxicillin -clavulanate (AUGMENTIN ) 875-125 MG tablet Take 1 tablet by mouth 2 (two) times daily.   diphenhydramine-acetaminophen   (TYLENOL  PM) 25-500 MG TABS tablet Take 1 tablet by mouth at bedtime as needed.   ibuprofen  (ADVIL ) 200 MG tablet Take 600 mg by mouth every 6 (six) hours as needed.   Multiple Vitamins-Minerals (ONE-A-DAY MENS 50+ ADVANTAGE PO) Take 1 tablet by mouth daily.   mupirocin  ointment (BACTROBAN ) 2 % Apply 1 Application topically 2 (two) times daily.   timolol  (TIMOPTIC ) 0.5 % ophthalmic solution Place 1 drop into both eyes at bedtime.    [DISCONTINUED] atorvastatin  (LIPITOR) 20 MG tablet Take 1 tablet (20 mg total) by mouth daily.   Acetaminophen  (TYLENOL  EXTRA STRENGTH PO) Take by mouth as needed. (Patient not taking: Reported on 01/26/2024)   atorvastatin  (LIPITOR) 20 MG tablet Take 1 tablet (20 mg total) by mouth daily.   Omega-3 Fatty Acids (FISH OIL) 500 MG CAPS Take 1,000 mg by mouth daily. (Patient not taking: Reported on 01/26/2024)   No facility-administered encounter medications on file as of 01/26/2024.    BP 112/70   Pulse (!) 59   Ht 5' 6 (1.676 m)   Wt 177 lb 4 oz (80.4 kg)   SpO2 96%   BMI 28.61 kg/m  BP Readings from Last 3 Encounters:  01/26/24 112/70  10/08/23 110/76  10/05/23 128/84    Physical Exam Constitutional:      Appearance: Normal appearance.  Cardiovascular:     Rate and Rhythm: Normal rate and regular rhythm.  Pulmonary:     Effort: Pulmonary effort is normal.  Breath sounds: No rhonchi or rales.  Abdominal:     General: Abdomen is flat. Bowel sounds are normal. There is no distension.     Palpations: Abdomen is soft.     Tenderness: There is no abdominal tenderness.  Musculoskeletal:     Right lower leg: No edema.     Left lower leg: No edema.     Comments: LLE is cool with decreased DP pulse of the left compare to the right, no signs of wounds or abrasions, LLE appears atrophied, decreased strength of the left ankle particularly with dorsiflexion or again gravity, able to move toes and normal stregnth of the knee  Skin:    General: Skin is warm  and dry.     Capillary Refill: Capillary refill takes less than 2 seconds.  Neurological:     Mental Status: He is alert and oriented to person, place, and time.     Motor: Weakness (left ankle) present.     Comments: Left foot drop  Psychiatric:        Mood and Affect: Mood normal.        Behavior: Behavior normal.        01/26/2024    1:28 PM 10/08/2023   10:14 AM 08/28/2023   10:06 AM  Depression screen PHQ 2/9  Decreased Interest 0 0 0  Down, Depressed, Hopeless 3 1 0  PHQ - 2 Score 3 1 0  Altered sleeping 0 1   Tired, decreased energy 0 1   Change in appetite 0 0   Feeling bad or failure about yourself  0 0   Trouble concentrating 0 0   Moving slowly or fidgety/restless 0 0   Suicidal thoughts 0 0   PHQ-9 Score 3 3   Difficult doing work/chores Not difficult at all Not difficult at all        01/26/2024    1:29 PM 08/20/2022    9:57 AM 05/22/2022   11:10 AM 11/18/2021   11:24 AM  GAD 7 : Generalized Anxiety Score  Nervous, Anxious, on Edge 0 0 0 0  Control/stop worrying 2 0 0 0  Worry too much - different things 2 0 0 0  Trouble relaxing 0 0 0 0  Restless 0 0 0 0  Easily annoyed or irritable 2 0 0 0  Afraid - awful might happen 0 0 0 0  Total GAD 7 Score 6 0 0 0  Anxiety Difficulty Not difficult at all Not difficult at all Not difficult at all Not difficult at all    No results found for any visits on 01/26/24.  Last CBC Lab Results  Component Value Date   WBC 7.1 04/09/2020   HGB 16.8 04/09/2020   HCT 48.3 04/09/2020   MCV 90.6 04/09/2020   MCH 31.5 04/09/2020   RDW 12.5 04/09/2020   PLT 222 04/09/2020   Last metabolic panel Lab Results  Component Value Date   GLUCOSE 94 08/28/2023   NA 142 08/28/2023   K 4.7 08/28/2023   CL 104 08/28/2023   CO2 25 08/28/2023   BUN 14 08/28/2023   CREATININE 0.94 08/28/2023   EGFR 80 08/28/2023   CALCIUM  9.4 08/28/2023   PHOS 3.5 08/20/2022   PROT 6.8 08/28/2023   ALBUMIN  4.4 08/28/2023   LABGLOB 2.4  08/28/2023   BILITOT 1.1 08/28/2023   ALKPHOS 104 08/28/2023   AST 24 08/28/2023   ALT 17 08/28/2023   ANIONGAP 9 04/09/2020   Last lipids Lab Results  Component Value Date   CHOL 204 (H) 10/13/2023   HDL 52 10/13/2023   LDLCALC 136 (H) 10/13/2023   TRIG 88 10/13/2023   CHOLHDL 4.0 08/09/2019    The ASCVD Risk score (Arnett DK, et al., 2019) failed to calculate for the following reasons:   The 2019 ASCVD risk score is only valid for ages 52 to 55    Assessment & Plan:  Left foot drop Likely related to severe L4-L5 neuroforaminal stenosis. Evident on MRI from 05/23/2022. He will follow up with neurosurgery. Given diminished DP pulse on left will also make referral to vascular surgery. Start ASA 81 mg daily. Refilled atorvastatin .  Other orders -     Atorvastatin  Calcium ; Take 1 tablet (20 mg total) by mouth daily.  Dispense: 90 tablet; Refill: 0     Return in about 2 months (around 03/27/2024) for transfer of care.    Harlene Saddler, MD

## 2024-01-26 NOTE — Telephone Encounter (Signed)
 Appointment today 01/26/2024 with Harlene Saddler at 1:20 PM    FYI Only or Action Required?: FYI only for provider.  Patient was last seen in primary care on 10/08/2023 by Joshua Cathryne BROCKS, MD.  Called Nurse Triage reporting No chief complaint on file..  Symptoms began 1-1.5 months ago.  Interventions attempted: Other: wearing a brace for already present drop foot--previously diagnosed.  Symptoms are: gradually worsening.  Triage Disposition: See HCP Within 4 Hours (Or PCP Triage)  Patient/caregiver understands and will follow disposition?: Yes              Patient would like a referral to a vascular specialist due to poor blood circulation in his lower left leg and foot. He believes he has 'drop foot'. He left is much colder than the other leg. Call back #[phone]. He really would like to speak to someone that knows him. Attempted to call the office but there was a long holding time. Reason for Disposition  Nursing judgment or information in reference  Answer Assessment - Initial Assessment Questions Patient denies any pain or swelling  Patient is advised to call us  back with any questions or concerns Patient is advised that if anything worsens to go to the Emergency Room. Patient verbalized understanding.     1. REASON FOR CALL: What is your main concern right now?     Left lower leg circulation concerns---history of drop foot and now this left lower leg is starting to feel cooler and patient wants a referral to a vascular specialist 2. ONSET: When did the symptom start?     1 month-6 weeks ago 3. SEVERITY: How bad is the left lower leg problem?     -------  6. FEVER: Do you have a fever?     No 7. OTHER SYMPTOMS: Do you have any other new symptoms?     no 8. TREATMENTS AND RESPONSE: What have you done so far to try to make this better? What medicines have you used?     Patient has been wearing a brace for drop foot that has been an issue  for over a year  Protocols used: No Guideline Available-A-AH

## 2024-02-16 DIAGNOSIS — Z6828 Body mass index (BMI) 28.0-28.9, adult: Secondary | ICD-10-CM | POA: Diagnosis not present

## 2024-02-16 DIAGNOSIS — M5416 Radiculopathy, lumbar region: Secondary | ICD-10-CM | POA: Diagnosis not present

## 2024-02-16 DIAGNOSIS — M21372 Foot drop, left foot: Secondary | ICD-10-CM | POA: Diagnosis not present

## 2024-03-03 ENCOUNTER — Other Ambulatory Visit (INDEPENDENT_AMBULATORY_CARE_PROVIDER_SITE_OTHER): Payer: Self-pay | Admitting: Nurse Practitioner

## 2024-03-03 DIAGNOSIS — H903 Sensorineural hearing loss, bilateral: Secondary | ICD-10-CM | POA: Diagnosis not present

## 2024-03-03 DIAGNOSIS — I739 Peripheral vascular disease, unspecified: Secondary | ICD-10-CM

## 2024-03-03 DIAGNOSIS — R0989 Other specified symptoms and signs involving the circulatory and respiratory systems: Secondary | ICD-10-CM

## 2024-03-03 DIAGNOSIS — M21379 Foot drop, unspecified foot: Secondary | ICD-10-CM

## 2024-03-04 ENCOUNTER — Ambulatory Visit (INDEPENDENT_AMBULATORY_CARE_PROVIDER_SITE_OTHER): Payer: Self-pay

## 2024-03-04 ENCOUNTER — Ambulatory Visit (INDEPENDENT_AMBULATORY_CARE_PROVIDER_SITE_OTHER): Payer: Self-pay | Admitting: Vascular Surgery

## 2024-03-04 ENCOUNTER — Encounter (INDEPENDENT_AMBULATORY_CARE_PROVIDER_SITE_OTHER): Payer: Self-pay | Admitting: Vascular Surgery

## 2024-03-04 VITALS — BP 134/81 | HR 54 | Ht 66.0 in | Wt 174.0 lb

## 2024-03-04 DIAGNOSIS — E7800 Pure hypercholesterolemia, unspecified: Secondary | ICD-10-CM

## 2024-03-04 DIAGNOSIS — R0989 Other specified symptoms and signs involving the circulatory and respiratory systems: Secondary | ICD-10-CM | POA: Diagnosis not present

## 2024-03-04 DIAGNOSIS — I739 Peripheral vascular disease, unspecified: Secondary | ICD-10-CM

## 2024-03-04 DIAGNOSIS — R29818 Other symptoms and signs involving the nervous system: Secondary | ICD-10-CM | POA: Diagnosis not present

## 2024-03-04 DIAGNOSIS — M21372 Foot drop, left foot: Secondary | ICD-10-CM | POA: Diagnosis not present

## 2024-03-04 DIAGNOSIS — M21379 Foot drop, unspecified foot: Secondary | ICD-10-CM | POA: Diagnosis not present

## 2024-03-04 NOTE — Progress Notes (Signed)
 Patient ID: Kevin Oneal, male   DOB: September 05, 1940, 83 y.o.   MRN: [id number]  Chief Complaint  Patient presents with   Establish Care    HPI Kevin Oneal is a 83 y.o. male.  I am asked to see the patient by Dr. Lemon for evaluation of cool discolored left foot with foot drop.  The patient had back surgery about 2 years ago and has developed a left foot drop.  He walks with a foot drop splint.  He has lost significant muscle mass in the left lower leg.  He has no ulceration or wound.  The foot is fairly numb but not necessarily painful.  It is noticeably cooler than the right side and also often has some discoloration.  To evaluate him for arterial insufficiency as a possible cause, noninvasive studies were performed today.  ABIs were 1.07 on the right and 1.11 on the left with triphasic waveforms and normal digital pressures and waveforms bilaterally consistent with no arterial insufficiency.     Past Medical History:  Diagnosis Date   Arthritis    hands esp right   Back pain    nerves in right leg, right thigh to foot feels numb sometimes   Cancer (HCC)    prostate   GERD (gastroesophageal reflux disease)    History of shingles    HOH (hard of hearing)    aids   Motion sickness    inner ear issues   Seasonal allergies     Past Surgical History:  Procedure Laterality Date   CARPAL TUNNEL RELEASE Bilateral    CATARACT EXTRACTION W/PHACO Right 10/19/2019   Procedure: CATARACT EXTRACTION PHACO AND INTRAOCULAR LENS PLACEMENT (IOC) RIGHT 4.73 00:59.9 7.9%;  Surgeon: Mittie Gaskin, MD;  Location[facility];  Service: Ophthalmology;  Laterality: Right;   CATARACT EXTRACTION W/PHACO Left 12/28/2019   Procedure: CATARACT EXTRACTION PHACO AND INTRAOCULAR LENS PLACEMENT (IOC) LEFT TORIC LENS;  Surgeon: Mittie Gaskin, MD;  Location: [facility];  Service: Ophthalmology;  Laterality: Left;  5.56 1:18.7 7.0%   COLONOSCOPY  06/10/2011   Dr Ora- normal    EYE SURGERY     JOINT REPLACEMENT     LUMBAR LAMINECTOMY/DECOMPRESSION MICRODISCECTOMY N/A 04/11/2020   Procedure: Laminectomy and Foraminotomy - Lumbar two-Lumbar three, Lumbar three-Lumbar four, Lumbar four-Lumbar five;  Surgeon: Onetha Kuba, MD;  [facility];  Service: Neurosurgery;  Laterality: N/A;   PROSTATE SURGERY     removal of prostate due to cancer   REPLACEMENT TOTAL KNEE BILATERAL     UPPER GI ENDOSCOPY       Family History  Problem Relation Age of Onset   Cancer Father    Diabetes Mother    Diabetes Brother       Social History   Tobacco Use   Smoking status: Former    Current packs/day: 0.00    Average packs/day: 0.5 packs/day for 11.0 years (5.5 ttl pk-yrs)    Types: Cigarettes    Start date: 76    Quit date: 1980    Years since quitting: 45.7    Passive exposure: Never   Smokeless tobacco: Never   Tobacco comments:    smoking cessation materials not required  Vaping Use   Vaping status: Never Used  Substance Use Topics   Alcohol use: Yes    Alcohol/week: 7.0 standard drinks of alcohol    Types: 7 Shots of liquor per week    Comment: one drink (vodka and tonic) every evening  at 5 o'clock   Drug use: No     No Known Allergies  Current Outpatient Medications  Medication Sig Dispense Refill   Acetaminophen  (TYLENOL  EXTRA STRENGTH PO) Take by mouth as needed. (Patient not taking: Reported on 03/04/2024)     amoxicillin -clavulanate (AUGMENTIN ) 875-125 MG tablet Take 1 tablet by mouth 2 (two) times daily. 20 tablet 0   atorvastatin  (LIPITOR) 20 MG tablet Take 1 tablet (20 mg total) by mouth daily. 90 tablet 0   diphenhydramine-acetaminophen  (TYLENOL  PM) 25-500 MG TABS tablet Take 1 tablet by mouth at bedtime as needed.     ibuprofen  (ADVIL ) 200 MG tablet Take 600 mg by mouth every 6 (six) hours as needed.     Multiple Vitamins-Minerals (ONE-A-DAY MENS 50+ ADVANTAGE PO) Take 1 tablet by mouth daily.     mupirocin  ointment (BACTROBAN ) 2 % Apply 1  Application topically 2 (two) times daily. 22 g 0   Omega-3 Fatty Acids (FISH OIL) 500 MG CAPS Take 1,000 mg by mouth daily. (Patient not taking: Reported on 03/04/2024)     timolol  (TIMOPTIC ) 0.5 % ophthalmic solution Place 1 drop into both eyes at bedtime.      No current facility-administered medications for this visit.      REVIEW OF SYSTEMS (Negative unless checked)  Constitutional: [] Weight loss  [] Fever  [] Chills Cardiac: [] Chest pain   [] Chest pressure   [] Palpitations   [] Shortness of breath when laying flat   [] Shortness of breath at rest   [] Shortness of breath with exertion. Vascular:  [] Pain in legs with walking   [] Pain in legs at rest   [] Pain in legs when laying flat   [] Claudication   [] Pain in feet when walking  [] Pain in feet at rest  [] Pain in feet when laying flat   [] History of DVT   [] Phlebitis   [] Swelling in legs   [] Varicose veins   [] Non-healing ulcers Pulmonary:   [] Uses home oxygen   [] Productive cough   [] Hemoptysis   [] Wheeze  [] COPD   [] Asthma Neurologic:  [] Dizziness  [] Blackouts   [] Seizures   [] History of stroke   [] History of TIA  [] Aphasia   [] Temporary blindness   [] Dysphagia   [] Weakness or numbness in arms   [x] Weakness or numbness in legs Musculoskeletal:  [x] Arthritis   [] Joint swelling   [] Joint pain   [x] Low back pain Hematologic:  [] Easy bruising  [] Easy bleeding   [] Hypercoagulable state   [] Anemic  [] Hepatitis Gastrointestinal:  [] Blood in stool   [] Vomiting blood  [x] Gastroesophageal reflux/heartburn   [] Abdominal pain Genitourinary:  [] Chronic kidney disease   [] Difficult urination  [] Frequent urination  [] Burning with urination   [] Hematuria Skin:  [] Rashes   [] Ulcers   [] Wounds Psychological:  [] History of anxiety   []  History of major depression.    Physical Exam BP 134/81   Pulse (!) 54   Ht 5' 6 (1.676 m)   Wt 174 lb (78.9 kg)   BMI 28.08 kg/m  Gen:  WD/WN, NAD.  Appears much younger than stated age Head: [place]/AT, No temporalis  wasting.  Ear/Nose/Throat: Hearing grossly intact, nares w/o erythema or drainage, oropharynx w/o Erythema/Exudate Eyes: Conjunctiva clear, sclera non-icteric  Neck: trachea midline.  No JVD.  Pulmonary:  Good air movement, respirations not labored, no use of accessory muscles  Cardiac: Bradycardic Vascular:  Vessel Right Left  Radial Palpable Palpable  DP Palpable Palpable  PT Palpable Palpable   Gastrointestinal:. No masses, surgical incisions, or scars. Musculoskeletal: M/S 5/5 throughout.  Left lower leg is cooler than the right.  There is muscular atrophy in the left calf and lower leg.  Foot drop splint is in place on the left leg.  No edema. Neurologic:  Speech is fluent. Motor exam as listed above. Psychiatric: Judgment intact, Mood & affect appropriate for pt's clinical situation. Dermatologic: No rashes or ulcers noted.  No cellulitis or open wounds.    Radiology No results found.  Labs No results found for this or any previous visit (from the past 2160 hours).  Assessment/Plan:  Foot drop, left foot ABIs were 1.07 on the right and 1.11 on the left with triphasic waveforms and normal digital pressures and waveforms bilaterally consistent with no arterial insufficiency.  His foot drop and atrophy are not due to arterial insufficiency.  I would suspect a neurogenic cause and he has nerve conduction study scheduled in the coming weeks in [place].  I will see him back on an as-needed basis.  Neurogenic claudication (HCC) Previous back surgery.  Pure hypercholesterolemia lipid control important in reducing the progression of atherosclerotic disease. Continue statin therapy      Selinda Gu 03/04/2024, 9:25 AM   This note was created with Dragon medical transcription system.  Any errors from dictation are unintentional.

## 2024-03-04 NOTE — Assessment & Plan Note (Signed)
 lipid control important in reducing the progression of atherosclerotic disease. Continue statin therapy

## 2024-03-04 NOTE — Assessment & Plan Note (Signed)
 ABIs were 1.07 on the right and 1.11 on the left with triphasic waveforms and normal digital pressures and waveforms bilaterally consistent with no arterial insufficiency.  His foot drop and atrophy are not due to arterial insufficiency.  I would suspect a neurogenic cause and he has nerve conduction study scheduled in the coming weeks in [place].  I will see him back on an as-needed basis.

## 2024-03-04 NOTE — Assessment & Plan Note (Signed)
 Previous back surgery.

## 2024-03-07 LAB — VAS US ABI WITH/WO TBI
Left ABI: 1.11
Right ABI: 1.07

## 2024-03-08 DIAGNOSIS — H401131 Primary open-angle glaucoma, bilateral, mild stage: Secondary | ICD-10-CM | POA: Diagnosis not present

## 2024-03-08 DIAGNOSIS — H35371 Puckering of macula, right eye: Secondary | ICD-10-CM | POA: Diagnosis not present

## 2024-03-22 ENCOUNTER — Ambulatory Visit: Payer: Self-pay

## 2024-03-22 ENCOUNTER — Encounter: Admitting: Student

## 2024-03-22 NOTE — Telephone Encounter (Signed)
 fyi

## 2024-03-22 NOTE — Telephone Encounter (Signed)
 FYI Only or Action Required?: FYI only for provider.  Patient was last seen in primary care on 01/26/2024 by Lemon Raisin, MD.  Called Nurse Triage reporting Cough, Headache, and Generalized Body Aches.  Symptoms began several days ago.  Interventions attempted: OTC medications: Robitussin, ibuprofen .  Symptoms are: productive cough with yellow mucus, body aches, headache, chest congestion, decreased appetite stable.  Triage Disposition: See Physician Within 24 Hours  Patient/caregiver understands and will follow disposition?: Yes           Copied from CRM #[id number]. Topic: Clinical - Red Word Triage >> Mar 22, 2024  2:20 PM Kevin Oneal wrote: Red Word that prompted transfer to Nurse Triage: Body aching, coughing, congestion chest, headache. Reason for Disposition  [1] Continuous (nonstop) coughing interferes with work or school AND [2] no improvement using cough treatment per Care Advice    Patient states he is not able to rest at night due to cough  Answer Assessment - Initial Assessment Questions 1. ONSET: When did the cough begin?      Sunday.  2. SEVERITY: How bad is the cough today?      7/10 right now. Worse at night. Body aches and soreness, headache from coughing.  3. SPUTUM: Describe the color of your sputum (e.g., none, dry cough; clear, white, yellow, green)     Yellow.  4. HEMOPTYSIS: Are you coughing up any blood? If Yes, ask: How much? (e.g., flecks, streaks, tablespoons, etc.)     No.  5. DIFFICULTY BREATHING: Are you having difficulty breathing? If Yes, ask: How bad is it? (e.g., mild, moderate, severe)      No.  6. FEVER: Do you have a fever? If Yes, ask: What is your temperature, how was it measured, and when did it start?     No. Temperature 99.6.  7. CARDIAC HISTORY: Do you have any history of heart disease? (e.g., heart attack, congestive heart failure)      No.  8. LUNG HISTORY: Do you have any history of lung disease?   (e.g., pulmonary embolus, asthma, emphysema)     No.  9. PE RISK FACTORS: Do you have a history of blood clots? (or: recent major surgery, recent prolonged travel, bedridden)     Traveled to [place]  a couple weeks ago and states the flights were about 4-5 hours one way.  10. OTHER SYMPTOMS: Do you have any other symptoms? (e.g., runny nose, wheezing, chest pain)       Chest congestion, body aches, headache, decreased appetite. Denies nausea, vomiting, diarrhea, sore throat, sinus pain. Patient states he has been able to eat and drink still.  11. PREGNANCY: Is there any chance you are pregnant? When was your last menstrual period?       N/A.  12. TRAVEL: Have you traveled out of the country in the last month? (e.g., travel history, exposures)       No travel out of the country. 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  Protocols used: Cough - Acute Productive-A-AH

## 2024-03-23 ENCOUNTER — Encounter: Payer: Self-pay | Admitting: Student

## 2024-03-23 ENCOUNTER — Ambulatory Visit (INDEPENDENT_AMBULATORY_CARE_PROVIDER_SITE_OTHER): Admitting: Student

## 2024-03-23 VITALS — BP 134/78 | HR 62 | Temp 98.3°F | Ht 66.0 in | Wt 175.0 lb

## 2024-03-23 DIAGNOSIS — U071 COVID-19: Secondary | ICD-10-CM

## 2024-03-23 DIAGNOSIS — J1282 Pneumonia due to coronavirus disease 2019: Secondary | ICD-10-CM | POA: Diagnosis not present

## 2024-03-23 LAB — POC COVID19/FLU A&B COMBO
Covid Antigen, POC: POSITIVE — AB
Influenza A Antigen, POC: NEGATIVE
Influenza B Antigen, POC: NEGATIVE

## 2024-03-23 NOTE — Progress Notes (Signed)
 Established Patient Office Visit  Subjective   Patient ID: Kevin Oneal, male    DOB: Mar 22, 1941  Age: 83 y.o. MRN: [id number]  Chief Complaint  Patient presents with   Cough    Yellow mucus, taking OTC medication, nasal congestion, since Sunday     Headache   Fever    99.6     Kevin Oneal with medical hx listed below presents today for cough, nasal congestion, headache, and body aches that started 3 days ago.  Reports sx started why he was at [facility].  Coughing up yellow sputum. T max at home 99.14F. Was feeling worse 2 days ago when he made appointment but is now feeling better. Still has some cough but body aches improved with ibuprofen . Robitussin is helping with cough. He denies fever, chills, cp, shortness of breath, dizziness, or LOC. Eating and drinking well. Lives with daughter and granddaughter who have been well. Denies sick contacts.   Patient Active Problem List   Diagnosis Date Noted   Foot drop, left foot 03/04/2024   Pneumonia due to COVID-19 virus 03/12/2022   Spinal stenosis of lumbar region 04/11/2020   Body mass index (BMI) 28.0-28.9, adult 03/20/2020   Elevated blood-pressure reading, without diagnosis of hypertension 03/20/2020   Neurogenic claudication 02/02/2020   Rotator cuff syndrome of left shoulder 06/30/2018   Primary osteoarthritis of first carpometacarpal joint of right hand 09/08/2017   Hand pain, right 09/08/2017   Pure hypercholesterolemia 07/30/2017   OA (osteoarthritis) of knee 10/05/2013   Personal history of other malignant neoplasm of skin 02/28/2013   Barrett's esophagus 03/17/2012      ROS Refer to HPI    Objective:     Outpatient Encounter Medications as of 03/23/2024  Medication Sig   Acetaminophen  (TYLENOL  EXTRA STRENGTH PO) Take by mouth as needed.   atorvastatin  (LIPITOR) 20 MG tablet Take 1 tablet (20 mg total) by mouth daily.   diphenhydramine-acetaminophen  (TYLENOL  PM) 25-500 MG TABS tablet Take 1 tablet by mouth  at bedtime as needed.   ibuprofen  (ADVIL ) 200 MG tablet Take 600 mg by mouth every 6 (six) hours as needed.   Multiple Vitamins-Minerals (ONE-A-DAY MENS 50+ ADVANTAGE PO) Take 1 tablet by mouth daily.   mupirocin  ointment (BACTROBAN ) 2 % Apply 1 Application topically 2 (two) times daily.   timolol  (TIMOPTIC ) 0.5 % ophthalmic solution Place 1 drop into both eyes at bedtime.    [DISCONTINUED] amoxicillin -clavulanate (AUGMENTIN ) 875-125 MG tablet Take 1 tablet by mouth 2 (two) times daily.   Omega-3 Fatty Acids (FISH OIL) 500 MG CAPS Take 1,000 mg by mouth daily. (Patient not taking: Reported on 03/23/2024)   No facility-administered encounter medications on file as of 03/23/2024.    BP 134/78   Pulse 62   Temp 98.3 F (36.8 C) (Oral)   Ht 5' 6 (1.676 m)   Wt 175 lb (79.4 kg)   SpO2 96%   BMI 28.25 kg/m  BP Readings from Last 3 Encounters:  03/23/24 134/78  03/04/24 134/81  01/26/24 112/70    Physical Exam Constitutional:      General: He is not in acute distress.    Appearance: Normal appearance.  HENT:     Mouth/Throat:     Mouth: Mucous membranes are moist.     Pharynx: Oropharynx is clear.  Eyes:     Extraocular Movements: Extraocular movements intact.     Pupils: Pupils are equal, round, and reactive to light.  Cardiovascular:     Rate and  Rhythm: Normal rate and regular rhythm.  Pulmonary:     Effort: Pulmonary effort is normal. No respiratory distress.     Breath sounds: Normal breath sounds. No rhonchi or rales.  Abdominal:     General: Abdomen is flat. Bowel sounds are normal. There is no distension.     Palpations: Abdomen is soft.     Tenderness: There is no abdominal tenderness.  Musculoskeletal:        General: Normal range of motion.     Right lower leg: No edema.     Left lower leg: No edema.  Skin:    General: Skin is warm and dry.     Capillary Refill: Capillary refill takes less than 2 seconds.  Neurological:     General: No focal deficit present.      Mental Status: He is alert and oriented to person, place, and time.  Psychiatric:        Mood and Affect: Mood normal.        Behavior: Behavior normal.        01/26/2024    1:28 PM 10/08/2023   10:14 AM 08/28/2023   10:06 AM  Depression screen PHQ 2/9  Decreased Interest 0 0 0  Down, Depressed, Hopeless 3 1 0  PHQ - 2 Score 3 1 0  Altered sleeping 0 1   Tired, decreased energy 0 1   Change in appetite 0 0   Feeling bad or failure about yourself  0 0   Trouble concentrating 0 0   Moving slowly or fidgety/restless 0 0   Suicidal thoughts 0 0   PHQ-9 Score 3 3   Difficult doing work/chores Not difficult at all Not difficult at all        01/26/2024    1:29 PM 08/20/2022    9:57 AM 05/22/2022   11:10 AM 11/18/2021   11:24 AM  GAD 7 : Generalized Anxiety Score  Nervous, Anxious, on Edge 0 0 0 0  Control/stop worrying 2 0 0 0  Worry too much - different things 2 0 0 0  Trouble relaxing 0 0 0 0  Restless 0 0 0 0  Easily annoyed or irritable 2 0 0 0  Afraid - awful might happen 0 0 0 0  Total GAD 7 Score 6 0 0 0  Anxiety Difficulty Not difficult at all Not difficult at all Not difficult at all Not difficult at all    No results found for any visits on 03/23/24.  Last CBC Lab Results  Component Value Date   WBC 7.1 04/09/2020   HGB 16.8 04/09/2020   HCT 48.3 04/09/2020   MCV 90.6 04/09/2020   MCH 31.5 04/09/2020   RDW 12.5 04/09/2020   PLT 222 04/09/2020   Last metabolic panel Lab Results  Component Value Date   GLUCOSE 94 08/28/2023   NA 142 08/28/2023   K 4.7 08/28/2023   CL 104 08/28/2023   CO2 25 08/28/2023   BUN 14 08/28/2023   CREATININE 0.94 08/28/2023   EGFR 80 08/28/2023   CALCIUM  9.4 08/28/2023   PHOS 3.5 08/20/2022   PROT 6.8 08/28/2023   ALBUMIN  4.4 08/28/2023   LABGLOB 2.4 08/28/2023   BILITOT 1.1 08/28/2023   ALKPHOS 104 08/28/2023   AST 24 08/28/2023   ALT 17 08/28/2023   ANIONGAP 9 04/09/2020   Last lipids Lab Results  Component  Value Date   CHOL 204 (H) 10/13/2023   HDL 52 10/13/2023   LDLCALC 136 (  H) 10/13/2023   TRIG 88 10/13/2023   CHOLHDL 4.0 08/09/2019      The ASCVD Risk score (Arnett DK, et al., 2019) failed to calculate for the following reasons:   The 2019 ASCVD risk score is only valid for ages 42 to 15    Assessment & Plan:  Pneumonia due to COVID-19 virus Positive for COVID on office today. He is feeling well, afebrile with normal lung exam. Discussed paxlovid given risk for severe infection due to age, he is feeling much better and declined this. Continue tylenol , ibuprofen , and robitussin for symptomatic management. ED precautions reviewed for fever worsening shortness of breath. Follow up as needed.  -     POC Covid19/Flu A&B Antigen  Return if symptoms worsen or fail to improve, for covid.    Harlene Saddler, MD

## 2024-03-28 ENCOUNTER — Encounter: Admitting: Student

## 2024-04-04 DIAGNOSIS — M5417 Radiculopathy, lumbosacral region: Secondary | ICD-10-CM | POA: Diagnosis not present

## 2024-04-12 DIAGNOSIS — M5416 Radiculopathy, lumbar region: Secondary | ICD-10-CM | POA: Diagnosis not present

## 2024-04-12 DIAGNOSIS — Z6828 Body mass index (BMI) 28.0-28.9, adult: Secondary | ICD-10-CM | POA: Diagnosis not present

## 2024-04-22 ENCOUNTER — Other Ambulatory Visit: Payer: Self-pay | Admitting: Student

## 2024-04-23 NOTE — Telephone Encounter (Signed)
 Requested Prescriptions  Pending Prescriptions Disp Refills   atorvastatin  (LIPITOR) 20 MG tablet [Pharmacy Med Name: ATORVASTATIN  20 MG TABLET] 90 tablet 0    Sig: TAKE 1 TABLET BY MOUTH EVERY DAY     Cardiovascular:  Antilipid - Statins Failed - 04/23/2024 11:50 AM      Failed - Lipid Panel in normal range within the last 12 months    Cholesterol, Total  Date Value Ref Range Status  10/13/2023 204 (H) 100 - 199 mg/dL Final   LDL Chol Calc (NIH)  Date Value Ref Range Status  10/13/2023 136 (H) 0 - 99 mg/dL Final   HDL  Date Value Ref Range Status  10/13/2023 52 >39 mg/dL Final   Triglycerides  Date Value Ref Range Status  10/13/2023 88 0 - 149 mg/dL Final         Passed - Patient is not pregnant      Passed - Valid encounter within last 12 months    Recent Outpatient Visits           1 month ago Pneumonia due to COVID-19 virus   [facility], [facility]   2 months ago Left foot drop   [place] [facility], [place]   6 months ago Impetigo   [place] [facility], MD   6 months ago Abrasion of right knee, initial encounter   [facility] at [facility], Toribio SQUIBB, [place]   7 months ago Annual physical exam   [facility] at [facility], Deanna C, MD

## 2024-05-25 ENCOUNTER — Encounter: Payer: Self-pay | Admitting: Pharmacist

## 2024-05-25 NOTE — Progress Notes (Signed)
° °  05/25/2024  Patient ID: Kevin Oneal, male   DOB: 1940/07/16, 83 y.o.   MRN: [id number]  This patient is appearing on a report for being at risk of failing the adherence measure for cholesterol (statin) medications this calendar year.   Medication: atorvastatin  20 mg Last fill date: 04/23/2024 for 90 day supply  Lipid Panel     Component Value Date/Time   CHOL 204 (H) 10/13/2023 1110   TRIG 88 10/13/2023 1110   HDL 52 10/13/2023 1110   CHOLHDL 4.0 08/09/2019 0959   LDLCALC 136 (H) 10/13/2023 1110   LABVLDL 16 10/13/2023 1110   From review of telephone note from 10/16/2023, patient advised that he had not been taking atorvastatin  for 6 months prior to latest lab work. Atorvastatin  prescription re-ordered at that time  Insurance report was not up to date. No action needed at this time.   Sharyle Sia, PharmD, [facility] [phone]

## 2024-10-20 ENCOUNTER — Ambulatory Visit
